# Patient Record
Sex: Female | Born: 1995 | Race: Black or African American | Hispanic: No | Marital: Single | Smoking: Never smoker
Health system: Southern US, Community
[De-identification: ages and names within clinical notes are randomized; demographics above are authoritative.]

## PROBLEM LIST (undated history)

## (undated) DIAGNOSIS — N926 Irregular menstruation, unspecified: Secondary | ICD-10-CM

## (undated) DIAGNOSIS — Z8619 Personal history of other infectious and parasitic diseases: Secondary | ICD-10-CM

## (undated) HISTORY — DX: Irregular menstruation, unspecified: N92.6

## (undated) HISTORY — PX: TONSILLECTOMY: SUR1361

## (undated) HISTORY — DX: Personal history of other infectious and parasitic diseases: Z86.19

---

## 2015-01-18 ENCOUNTER — Ambulatory Visit (INDEPENDENT_AMBULATORY_CARE_PROVIDER_SITE_OTHER): Payer: BLUE CROSS/BLUE SHIELD | Admitting: Family Medicine

## 2015-01-18 ENCOUNTER — Encounter: Payer: Self-pay | Admitting: Family Medicine

## 2015-01-18 VITALS — BP 112/78 | HR 68 | Ht 62.5 in | Wt 132.8 lb

## 2015-01-18 DIAGNOSIS — Z7189 Other specified counseling: Secondary | ICD-10-CM

## 2015-01-18 DIAGNOSIS — Z30019 Encounter for initial prescription of contraceptives, unspecified: Secondary | ICD-10-CM

## 2015-01-18 DIAGNOSIS — Z23 Encounter for immunization: Secondary | ICD-10-CM

## 2015-01-18 DIAGNOSIS — Z113 Encounter for screening for infections with a predominantly sexual mode of transmission: Secondary | ICD-10-CM

## 2015-01-18 DIAGNOSIS — N926 Irregular menstruation, unspecified: Secondary | ICD-10-CM | POA: Diagnosis not present

## 2015-01-18 DIAGNOSIS — R350 Frequency of micturition: Secondary | ICD-10-CM | POA: Diagnosis not present

## 2015-01-18 DIAGNOSIS — Z7689 Persons encountering health services in other specified circumstances: Secondary | ICD-10-CM

## 2015-01-18 LAB — POCT URINALYSIS DIPSTICK
BILIRUBIN UA: NEGATIVE
GLUCOSE UA: NEGATIVE
KETONES UA: NEGATIVE
Leukocytes, UA: NEGATIVE
Nitrite, UA: NEGATIVE
UROBILINOGEN UA: NEGATIVE
pH, UA: 6

## 2015-01-18 LAB — POCT URINE PREGNANCY: Preg Test, Ur: NEGATIVE

## 2015-01-18 MED ORDER — MEDROXYPROGESTERONE ACETATE 150 MG/ML IM SUSP
150.0000 mg | Freq: Once | INTRAMUSCULAR | Status: AC
  Administered 2015-01-18: 150 mg via INTRAMUSCULAR

## 2015-01-18 NOTE — Progress Notes (Signed)
   Subjective:    Patient ID: Shannon Ware, female    DOB: August 30, 1995, 19 y.o.   MRN: [id number]  HPI Chief Complaint  Patient presents with  . get est    new pt get estbalished.irregular periods for a straight month and would like to start back depo get not have a period. will get flu shot today. would like std testing as well today   She is here to establish primary care. She would like to start getting Depo-Provera injections and would like STD testing. No other concerns.  Reports childhood asthma but grew out of it and no longer has any symptoms, has been years.   Works at [employer] and and in school at [employer] and studying to be in [employer]  Single, is not currently sexually active, last time 3 months ago. 2 sexual partners in past year, men only.  LMP: history of irregular periods and wants Depo injection. She started Depo injections 3 years ago and used them for approximately 2 years and stopped due to moving. She had no side effects or issues with the injections and states it stopped her period completely and she liked this.States her period did not return for almost 11 months after stopping period.  She has been having a month long period.  Reports mild general abdominal cramping and no abnormal vaginal discharge. Denies itching, vaginal irritation, fever, chills, nausea, vomiting. Reports recent change in eating habits and is only eating 1 or 2 meals per day and not eating much fiber.  Urinary frequency and feels like she doesn't empty her bladder all the time. Denies dysuria, urgency, or leakage.   States she had Chlamydia in past. Would like testing for STD including HIV and Syphilis.  Wants flu shot.   Reviewed allergies, medications, past medical, surgical, family and social history.   Review of Systems Pertinent positives and negatives in the history of present illness.    Objective:   Physical Exam  Alert and in no distress. Not otherwise examined.  Urine pregnancy  negative UA dipstick positive for blood (having vaginal bleeding) and trace of protein    Assessment & Plan:  Encounter for initial prescription of contraceptives - Plan: POCT urine pregnancy  Needs flu shot - Plan: Flu Vaccine QUAD 36+ mos IM  Encounter to establish care  Urinary frequency - Plan: POCT urinalysis dipstick  Screening for STD (sexually transmitted disease) - Plan: GC/chlamydia probe amp, urine, HIV antibody, RPR  Irregular menses  Spent a minimum of 45 minutes with patient and half of that in counseling regarding STDs, contraception, diet and exercise. Negative urine pregnancy test today. Discussed risks, side effects of Depo injection and patient is aware and had success with this in past. She verbalized understanding that she should continue to use a barrier method for next week as the Depo is not automatic birth control and does not protect against STDs. She will let me know if urinary symptoms continue. Discussed that vaginal bleeding may still take a few days to subside after getting the injection.  Flu shot was given. She will get her previous medical records including immunization sent to us.

## 2015-01-18 NOTE — Patient Instructions (Signed)

## 2015-01-19 LAB — GC/CHLAMYDIA PROBE AMP, URINE
CHLAMYDIA, SWAB/URINE, PCR: NEGATIVE
GC PROBE AMP, URINE: NEGATIVE

## 2015-01-19 LAB — RPR

## 2015-01-19 LAB — HIV ANTIBODY (ROUTINE TESTING W REFLEX): HIV 1&2 Ab, 4th Generation: NONREACTIVE

## 2015-03-14 ENCOUNTER — Emergency Department (HOSPITAL_COMMUNITY)
Admission: EM | Admit: 2015-03-14 | Discharge: 2015-03-14 | Disposition: A | Discharge status: Home / Self Care | Payer: Medicaid Other | Attending: Emergency Medicine | Admitting: Emergency Medicine

## 2015-03-14 ENCOUNTER — Encounter (HOSPITAL_COMMUNITY): Payer: Self-pay | Admitting: Vascular Surgery

## 2015-03-14 DIAGNOSIS — R110 Nausea: Secondary | ICD-10-CM | POA: Insufficient documentation

## 2015-03-14 DIAGNOSIS — F17200 Nicotine dependence, unspecified, uncomplicated: Secondary | ICD-10-CM | POA: Insufficient documentation

## 2015-03-14 DIAGNOSIS — Z3202 Encounter for pregnancy test, result negative: Secondary | ICD-10-CM | POA: Insufficient documentation

## 2015-03-14 DIAGNOSIS — N898 Other specified noninflammatory disorders of vagina: Secondary | ICD-10-CM | POA: Insufficient documentation

## 2015-03-14 DIAGNOSIS — Z8742 Personal history of other diseases of the female genital tract: Secondary | ICD-10-CM | POA: Insufficient documentation

## 2015-03-14 DIAGNOSIS — Z8619 Personal history of other infectious and parasitic diseases: Secondary | ICD-10-CM | POA: Diagnosis not present

## 2015-03-14 DIAGNOSIS — Z711 Person with feared health complaint in whom no diagnosis is made: Secondary | ICD-10-CM

## 2015-03-14 LAB — URINE MICROSCOPIC-ADD ON

## 2015-03-14 LAB — URINALYSIS, ROUTINE W REFLEX MICROSCOPIC
BILIRUBIN URINE: NEGATIVE
GLUCOSE, UA: NEGATIVE mg/dL
Ketones, ur: 15 mg/dL — AB
Nitrite: NEGATIVE
PH: 6 (ref 5.0–8.0)
Protein, ur: NEGATIVE mg/dL
SPECIFIC GRAVITY, URINE: 1.037 — AB (ref 1.005–1.030)

## 2015-03-14 LAB — POC URINE PREG, ED: Preg Test, Ur: NEGATIVE

## 2015-03-14 LAB — WET PREP, GENITAL
Clue Cells Wet Prep HPF POC: NONE SEEN
SPERM: NONE SEEN
TRICH WET PREP: NONE SEEN
Yeast Wet Prep HPF POC: NONE SEEN

## 2015-03-14 MED ORDER — STERILE WATER FOR INJECTION IJ SOLN
INTRAMUSCULAR | Status: AC
  Administered 2015-03-14: 0.9 mL
  Filled 2015-03-14: qty 10

## 2015-03-14 MED ORDER — AZITHROMYCIN 250 MG PO TABS
1000.0000 mg | ORAL_TABLET | Freq: Once | ORAL | Status: AC
  Administered 2015-03-14: 1000 mg via ORAL
  Filled 2015-03-14: qty 4

## 2015-03-14 MED ORDER — CEFTRIAXONE SODIUM 250 MG IJ SOLR
250.0000 mg | Freq: Once | INTRAMUSCULAR | Status: AC
  Administered 2015-03-14: 250 mg via INTRAMUSCULAR
  Filled 2015-03-14: qty 250

## 2015-03-14 NOTE — ED Provider Notes (Signed)
CSN: [id number]     Arrival date & time 03/14/15  1347 History  By signing my name below, I, Shannon Ware, attest that this documentation has been prepared under the direction and in the presence of [employer] Department StoresHanna Patel-Mills, PA-C. Electronically Signed: Placido SouLogan Ware, ED Scribe. 03/14/2015. 3:05 PM.   Chief Complaint  Patient presents with  . Vaginal Discharge   The history is provided by the patient and a parent. No language interpreter was used.    HPI Comments: Shannon KaufmannShania Ware is a 19 y.o. female with a hx of chlamydia 3 years ago who presents to the Emergency Department complaining of mild, yellow, non-odorous, vaginal discharge with onset 3 days ago. Pt notes having unprotected intercourse 4 days ago with a female partner further noting having 2 female sexual partners in the past 6 months. Pt notes having irregular menstrual cycles due to Depo-Provera injections. She denies n/v and abd pain.   Past Medical History  Diagnosis Date  . History of chlamydia   . Irregular menses    Past Surgical History  Procedure Laterality Date  . Tonsillectomy     Family History  Problem Relation Age of Onset  . Hypertension Mother   . Depression Mother   . Deep vein thrombosis Father   . Diabetes Maternal Grandfather    Social History  Substance Use Topics  . Smoking status: Current Every Day Smoker    Types: Cigars  . Smokeless tobacco: None  . Alcohol Use: No   OB History    No data available     Review of Systems  Gastrointestinal: Positive for nausea. Negative for vomiting and abdominal pain.  Genitourinary: Positive for vaginal discharge.  All other systems reviewed and are negative.  Allergies  Review of patient's allergies indicates no known allergies.  Home Medications   Prior to Admission medications   Not on File   BP 119/83 mmHg  Pulse 88  Temp(Src) 98.2 F (36.8 C) (Oral)  Resp 18  SpO2 100% Physical Exam  Constitutional: She is oriented to person, place, and time.  She appears well-developed and well-nourished.  HENT:  Head: Normocephalic and atraumatic.  Mouth/Throat: No oropharyngeal exudate.  Neck: Normal range of motion. No tracheal deviation present.  Cardiovascular: Normal rate.   Pulmonary/Chest: Effort normal. No respiratory distress.  Abdominal: Soft. There is no tenderness. There is no rebound and no guarding.  No abd TTP; no guarding or rebound   Genitourinary:  Chaperone present Copious amounts of malodorous greenish discharge but no vaginal bleeding; cervical os is closed; no adnexal tenderness  Musculoskeletal: Normal range of motion.  Neurological: She is alert and oriented to person, place, and time.  Skin: Skin is warm and dry. She is not diaphoretic.  Psychiatric: She has a normal mood and affect. Her behavior is normal.  Nursing note and vitals reviewed.  ED Course  Procedures  DIAGNOSTIC STUDIES: Oxygen Saturation is 100% on RA, normal by my interpretation.    COORDINATION OF CARE: 2:37 PM Pt presents today due to vaginal discharge. Discussed next steps with pt including a pelvic exam. Pt agreed to plan.   Labs Review Labs Reviewed  WET PREP, GENITAL - Abnormal; Notable for the following:    WBC, Wet Prep HPF POC MANY (*)    All other components within normal limits  URINALYSIS, ROUTINE W REFLEX MICROSCOPIC (NOT AT [facility]) - Abnormal; Notable for the following:    Color, Urine AMBER (*)    APPearance CLOUDY (*)    Specific  Gravity, Urine 1.037 (*)    Hgb urine dipstick TRACE (*)    Ketones, ur 15 (*)    Leukocytes, UA LARGE (*)    All other components within normal limits  URINE MICROSCOPIC-ADD ON - Abnormal; Notable for the following:    Squamous Epithelial / LPF 6-30 (*)    Bacteria, UA FEW (*)    All other components within normal limits  URINE CULTURE  POC URINE PREG, ED  GC/CHLAMYDIA PROBE AMP () NOT AT [facility]    Imaging Review No results found. I have personally reviewed and evaluated these lab  results as part of my medical decision-making.   EKG Interpretation None      MDM   Final diagnoses:  Vaginal discharge  Concern about STD in female without diagnosis   Patient presents for vaginal discharge after having intercourse. She has many white blood cells on wet prep. She was treated for gonorrhea and chlamydia. She is not pregnant. Pt advised on safe sex practices and understands that they have GC/Chlamydia cultures pending and will result in 2-3 days.  Pt encouraged to follow up at local health department for future STI checks. No concern for PID. Urine culture pending but she does not have any urinary symptoms such as dysuria, hematuria, urinary frequency, back pain, nausea, or vomiting. I discussed return precautions. Pt appears safe for discharge.  I personally performed the services described in this documentation, which was scribed in my presence. The recorded information has been reviewed and is accurate.  Medications  azithromycin (ZITHROMAX) tablet 1,000 mg (1,000 mg Oral Given 03/14/15 1611)  cefTRIAXone (ROCEPHIN) injection 250 mg (250 mg Intramuscular Given 03/14/15 1611)  sterile water (preservative free) injection (0.9 mLs  Given 03/14/15 1611)   Filed Vitals:   03/14/15 1403 03/14/15 1612  BP: 125/92 119/83  Pulse: 95 88  Temp: 98.1 F (36.7 C) 98.2 F (36.8 C)  Resp: 16 [address] PA-C 03/16/15 2356  Donnetta Hutching, MD 03/17/15 1048

## 2015-03-14 NOTE — ED Notes (Signed)
Pt reports to the ED for eval of thick yellow vaginal d/c since Sunday. Reports had unprotected sexual intercourse on Saturday. Denies any abd pain or vaginal bleeding. Pt A&Ox4, resp e/u, and skin warm and dry.

## 2015-03-15 LAB — GC/CHLAMYDIA PROBE AMP (~~LOC~~) NOT AT ARMC
CHLAMYDIA, DNA PROBE: NEGATIVE
Neisseria Gonorrhea: NEGATIVE

## 2015-03-15 LAB — URINE CULTURE
Culture: 20000
SPECIAL REQUESTS: NORMAL

## 2015-03-22 ENCOUNTER — Telehealth (HOSPITAL_BASED_OUTPATIENT_CLINIC_OR_DEPARTMENT_OTHER): Payer: Self-pay | Admitting: Emergency Medicine

## 2015-06-13 ENCOUNTER — Encounter: Payer: Self-pay | Admitting: Family Medicine

## 2015-06-13 ENCOUNTER — Ambulatory Visit (INDEPENDENT_AMBULATORY_CARE_PROVIDER_SITE_OTHER): Payer: BLUE CROSS/BLUE SHIELD | Admitting: Family Medicine

## 2015-06-13 ENCOUNTER — Other Ambulatory Visit: Payer: Self-pay | Admitting: Family Medicine

## 2015-06-13 VITALS — BP 126/72 | HR 68 | Wt 125.4 lb

## 2015-06-13 DIAGNOSIS — Z202 Contact with and (suspected) exposure to infections with a predominantly sexual mode of transmission: Secondary | ICD-10-CM | POA: Diagnosis not present

## 2015-06-13 DIAGNOSIS — Z23 Encounter for immunization: Secondary | ICD-10-CM | POA: Diagnosis not present

## 2015-06-13 DIAGNOSIS — S61209A Unspecified open wound of unspecified finger without damage to nail, initial encounter: Secondary | ICD-10-CM

## 2015-06-13 DIAGNOSIS — Z3049 Encounter for surveillance of other contraceptives: Secondary | ICD-10-CM

## 2015-06-13 LAB — POCT URINE PREGNANCY: PREG TEST UR: NEGATIVE

## 2015-06-13 MED ORDER — MEDROXYPROGESTERONE ACETATE 150 MG/ML IM SUSP
150.0000 mg | Freq: Once | INTRAMUSCULAR | Status: AC
  Administered 2015-06-13: 150 mg via INTRAMUSCULAR

## 2015-06-13 NOTE — Patient Instructions (Signed)
Safe Sex  Safe sex is about reducing the risk of giving or getting a sexually transmitted disease (STD). STDs are spread through sexual contact involving the genitals, mouth, or rectum. Some STDs can be cured and others cannot. Safe sex can also prevent unintended pregnancies.   WHAT ARE SOME SAFE SEX PRACTICES?  · Limit your sexual activity to only one partner who is having sex with only you.  · Talk to your partner about his or her past partners, past STDs, and drug use.  · Use a condom every time you have sexual intercourse. This includes vaginal, oral, and anal sexual activity. Both females and males should wear condoms during oral sex. Only use latex or polyurethane condoms and water-based lubricants. Using petroleum-based lubricants or oils to lubricate a condom will weaken the condom and increase the chance that it will break. The condom should be in place from the beginning to the end of sexual activity. Wearing a condom reduces, but does not completely eliminate, your risk of getting or giving an STD. STDs can be spread by contact with infected body fluids and skin.  · Get vaccinated for hepatitis B and HPV.  · Avoid alcohol and recreational drugs, which can affect your judgment. You may forget to use a condom or participate in high-risk sex.  · For females, avoid douching after sexual intercourse. Douching can spread an infection farther into the reproductive tract.  · Check your body for signs of sores, blisters, rashes, or unusual discharge. See your health care provider if you notice any of these signs.  · Avoid sexual contact if you have symptoms of an infection or are being treated for an STD. If you or your partner has herpes, avoid sexual contact when blisters are present. Use condoms at all other times.  · If you are at risk of being infected with HIV, it is recommended that you take a prescription medicine daily to prevent HIV infection. This is called pre-exposure prophylaxis (PrEP). You are  considered at risk if:    You are a man who has sex with other men (MSM).    You are a heterosexual man or woman who is sexually active with more than one partner.    You take drugs by injection.    You are sexually active with a partner who has HIV.  · Talk with your health care provider about whether you are at high risk of being infected with HIV. If you choose to begin PrEP, you should first be tested for HIV. You should then be tested every 3 months for as long as you are taking PrEP.  · See your health care provider for regular screenings, exams, and tests for other STDs. Before having sex with a new partner, each of you should be screened for STDs and should talk about the results with each other.  WHAT ARE THE BENEFITS OF SAFE SEX?   · There is less chance of getting or giving an STD.  · You can prevent unwanted or unintended pregnancies.  · By discussing safe sex concerns with your partner, you may increase feelings of intimacy, comfort, trust, and honesty between the two of you.     This information is not intended to replace advice given to you by your health care provider. Make sure you discuss any questions you have with your health care provider.     Document Released: 04/24/2004 Document Revised: 04/07/2014 Document Reviewed: 09/08/2011  Elsevier Interactive Patient Education ©2016 Elsevier Inc.

## 2015-06-13 NOTE — Progress Notes (Signed)
   Subjective:    Patient ID: Shannon Ware, female    DOB: April 21, 1995, 20 y.o.   MRN: [id number]  HPI Chief Complaint  Patient presents with  . cut finger    cut finger open-    She is here with complaints of laceration to tip of index finger on right hand. She states she was using a kitchen knife last night at 11 pm and accidentally cut her finger. Bandaid in place upon arrival and bleeding controlled.  She denies fever, chills, nausea.  Last tetanus was 2008.  She states she would also like to get her Depo-Provera injection while she is here. She last had one in October 2016.  She is also requesting STD testing. She denies vaginal discharge, itching, irritation. States she has 1 female sexual partner but is not sure he is faithful.   Review of Systems Pertinent positives and negatives in the history of present illness.     Objective:   Physical Exam BP 126/72 mmHg  Pulse 68  Wt 125 lb 6.4 oz (56.881 kg)  Avulsion with moderate tissue loss to the volar pad on index finger of right hand, no tendon or bone exposure. Nail intact to tip of finger. normal cap refill, sensation and ROM intact.      Assessment & Plan:  Avulsion, finger tip, initial encounter  Need for Tdap vaccination - Plan: Tdap vaccine greater than or equal to 7yo IM  Encounter for surveillance of other contraceptive - Plan: POCT urine pregnancy, medroxyPROGESTERone (DEPO-PROVERA) injection 150 mg  Possible exposure to STD - Plan: GC/chlamydia probe amp, urine  Discussed keeping wound clean and dry and covered as there is no tissue to suture. Also discussed that this will take several months to heal and that she may experience some numbness to the tip of the finger until the nerve endings have time to heal.  Tetanus updated today. Per patient request, Depo-Provera injection was given. Urine pregnancy test negative. Recommend that since there was a gap between her injections that she should use a barrier method for  protection against pregnancy for at least one week and that the injection will not be immediately effective.  GC chlamydia urine test ordered. Discussed safe sex.

## 2015-06-14 LAB — GC/CHLAMYDIA PROBE AMP
CT PROBE, AMP APTIMA: NOT DETECTED
GC Probe RNA: NOT DETECTED

## 2015-07-23 ENCOUNTER — Encounter: Payer: Self-pay | Admitting: Family Medicine

## 2017-02-05 ENCOUNTER — Encounter (HOSPITAL_COMMUNITY): Payer: Self-pay | Admitting: Emergency Medicine

## 2017-02-05 ENCOUNTER — Emergency Department (HOSPITAL_COMMUNITY)
Admission: EM | Admit: 2017-02-05 | Discharge: 2017-02-05 | Disposition: A | Discharge status: Home / Self Care | Payer: No Typology Code available for payment source | Attending: Emergency Medicine | Admitting: Emergency Medicine

## 2017-02-05 ENCOUNTER — Other Ambulatory Visit: Payer: Self-pay

## 2017-02-05 DIAGNOSIS — Y999 Unspecified external cause status: Secondary | ICD-10-CM | POA: Insufficient documentation

## 2017-02-05 DIAGNOSIS — Y9389 Activity, other specified: Secondary | ICD-10-CM | POA: Diagnosis not present

## 2017-02-05 DIAGNOSIS — F17200 Nicotine dependence, unspecified, uncomplicated: Secondary | ICD-10-CM | POA: Diagnosis not present

## 2017-02-05 DIAGNOSIS — Y92410 Unspecified street and highway as the place of occurrence of the external cause: Secondary | ICD-10-CM | POA: Diagnosis not present

## 2017-02-05 DIAGNOSIS — R51 Headache: Secondary | ICD-10-CM | POA: Insufficient documentation

## 2017-02-05 MED ORDER — METOCLOPRAMIDE HCL 10 MG PO TABS
10.0000 mg | ORAL_TABLET | Freq: Once | ORAL | Status: AC
  Administered 2017-02-05: 10 mg via ORAL
  Filled 2017-02-05: qty 1

## 2017-02-05 MED ORDER — DIPHENHYDRAMINE HCL 25 MG PO CAPS
25.0000 mg | ORAL_CAPSULE | Freq: Once | ORAL | Status: AC
  Administered 2017-02-05: 25 mg via ORAL
  Filled 2017-02-05: qty 1

## 2017-02-05 MED ORDER — IBUPROFEN 400 MG PO TABS
600.0000 mg | ORAL_TABLET | Freq: Once | ORAL | Status: AC
  Administered 2017-02-05: 600 mg via ORAL
  Filled 2017-02-05: qty 1
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
## 2017-03-31 NOTE — L&D Delivery Note (Addendum)
Delivery Note At 5:14 PM a viable female was delivered via Vaginal, Spontaneous (Presentation: cephalic).  APGAR: 7, 9; weight  pending.   Placenta status: spont and intact.  Cord: 3V  with the following complications: none.  Cord pH: n/a  Anesthesia:  epidural Episiotomy: None Lacerations: None Suture Repair: n/a Est. Blood Loss (mL): 50  Mom to postpartum.  Baby to Couplet care / Skin to Skin. Plans nexplanon for bc and plans to breast feed.  Shannon Ware 02/24/2018, 5:48 PM

## 2017-07-15 ENCOUNTER — Emergency Department (HOSPITAL_COMMUNITY)
Admission: EM | Admit: 2017-07-15 | Discharge: 2017-07-15 | Disposition: A | Discharge status: Home / Self Care | Payer: Medicaid Other | Attending: Emergency Medicine | Admitting: Emergency Medicine

## 2017-07-15 ENCOUNTER — Emergency Department (HOSPITAL_COMMUNITY): Payer: Medicaid Other

## 2017-07-15 ENCOUNTER — Encounter (HOSPITAL_COMMUNITY): Payer: Self-pay | Admitting: Emergency Medicine

## 2017-07-15 ENCOUNTER — Other Ambulatory Visit: Payer: Self-pay

## 2017-07-15 DIAGNOSIS — F17210 Nicotine dependence, cigarettes, uncomplicated: Secondary | ICD-10-CM | POA: Insufficient documentation

## 2017-07-15 DIAGNOSIS — O26891 Other specified pregnancy related conditions, first trimester: Secondary | ICD-10-CM | POA: Diagnosis not present

## 2017-07-15 DIAGNOSIS — R1032 Left lower quadrant pain: Secondary | ICD-10-CM | POA: Diagnosis not present

## 2017-07-15 DIAGNOSIS — O99331 Smoking (tobacco) complicating pregnancy, first trimester: Secondary | ICD-10-CM | POA: Insufficient documentation

## 2017-07-15 DIAGNOSIS — Z3A01 Less than 8 weeks gestation of pregnancy: Secondary | ICD-10-CM

## 2017-07-15 LAB — URINALYSIS, ROUTINE W REFLEX MICROSCOPIC
Bilirubin Urine: NEGATIVE
GLUCOSE, UA: NEGATIVE mg/dL
Hgb urine dipstick: NEGATIVE
Ketones, ur: 5 mg/dL — AB
LEUKOCYTES UA: NEGATIVE
NITRITE: NEGATIVE
PH: 6 (ref 5.0–8.0)
Protein, ur: NEGATIVE mg/dL
SPECIFIC GRAVITY, URINE: 1.012 (ref 1.005–1.030)

## 2017-07-15 LAB — CBC
HEMATOCRIT: 39.4 % (ref 36.0–46.0)
HEMOGLOBIN: 13 g/dL (ref 12.0–15.0)
MCH: 28.4 pg (ref 26.0–34.0)
MCHC: 33 g/dL (ref 30.0–36.0)
MCV: 86 fL (ref 78.0–100.0)
Platelets: 345 10*3/uL (ref 150–400)
RBC: 4.58 MIL/uL (ref 3.87–5.11)
RDW: 12 % (ref 11.5–15.5)
WBC: 10.4 10*3/uL (ref 4.0–10.5)

## 2017-07-15 LAB — COMPREHENSIVE METABOLIC PANEL
ALT: 11 U/L — ABNORMAL LOW (ref 14–54)
ANION GAP: 9 (ref 5–15)
AST: 19 U/L (ref 15–41)
Albumin: 4.2 g/dL (ref 3.5–5.0)
Alkaline Phosphatase: 67 U/L (ref 38–126)
BUN: 7 mg/dL (ref 6–20)
CHLORIDE: 101 mmol/L (ref 101–111)
CO2: 24 mmol/L (ref 22–32)
Calcium: 9.2 mg/dL (ref 8.9–10.3)
Creatinine, Ser: 0.8 mg/dL (ref 0.44–1.00)
GFR calc Af Amer: >60 mL/min (ref >60)
Glucose, Bld: 69 mg/dL (ref 65–99)
POTASSIUM: 3.3 mmol/L — AB (ref 3.5–5.1)
Sodium: 134 mmol/L — ABNORMAL LOW (ref 135–145)
TOTAL PROTEIN: 7.2 g/dL (ref 6.5–8.1)
Total Bilirubin: 0.6 mg/dL (ref 0.3–1.2)

## 2017-07-15 LAB — LIPASE, BLOOD: LIPASE: 41 U/L (ref 11–51)

## 2017-07-15 LAB — I-STAT BETA HCG BLOOD, ED (MC, WL, AP ONLY): I-stat hCG, quantitative: >2000 m[IU]/mL — ABNORMAL HIGH (ref <5)

## 2017-07-15 MED ORDER — METOCLOPRAMIDE HCL 10 MG PO TABS: 10.0000 mg | ORAL_TABLET | Freq: Three times a day (TID) | ORAL | 0 refills | Status: DC | PRN

## 2017-07-15 NOTE — ED Provider Notes (Signed)
[facility] EMERGENCY DEPARTMENT Provider Note   CSN: [id number] Arrival date & time: 07/15/17  1053     History   Chief Complaint Chief Complaint  Patient presents with  . Abdominal Pain  . Nausea    HPI Shannon Ware is a 22 y.o. female.  HPI Patient presented to the emergency room for evaluation of abdominal pain.  Patient states she has had some lower abdominal cramping on the left side in the left flank for the last few days.  The cramping seems to come and go and increases with certain activities and food.  She also has felt constantly nauseated.  Patient denies any vomiting.  She denies any diarrhea.  She did notice a dark stool a few days ago and has felt constipated and has not had normal bowel movement in the last few days.  Patient has irregular menstrual periods.  Her last menstrual period was in January.  She is not sure if she could be pregnant. Past Medical History:  Diagnosis Date  . History of chlamydia   . Irregular menses     There are no active problems to display for this patient.   Past Surgical History:  Procedure Laterality Date  . TONSILLECTOMY       OB History   None      Home Medications    Prior to Admission medications   Medication Sig Start Date End Date Taking? Authorizing Provider  metoCLOPramide (REGLAN) 10 MG tablet Take 1 tablet (10 mg total) by mouth every 8 (eight) hours as needed for nausea or vomiting. 07/15/17   Linwood Dibbles, MD    Family History Family History  Problem Relation Age of Onset  . Hypertension Mother   . Depression Mother   . Deep vein thrombosis Father   . Diabetes Maternal Grandfather     Social History Social History   Tobacco Use  . Smoking status: Current Every Day Smoker    Types: Cigars  . Smokeless tobacco: Never Used  Substance Use Topics  . Alcohol use: No    Alcohol/week: 0.0 oz  . Drug use: No     Allergies   Patient has no known allergies.   Review of  Systems Review of Systems  Constitutional: Negative for fever.  Respiratory: Negative for cough and shortness of breath.   Cardiovascular: Negative for chest pain.  Gastrointestinal: Negative for anal bleeding and blood in stool.  All other systems reviewed and are negative.    Physical Exam Updated Vital Signs BP 139/78   Pulse 81   Temp 98.3 F (36.8 C) (Oral)   Resp 16   Ht 1.6 m (5\' 3" )   Wt 54.4 kg (120 lb)   LMP 04/16/2017 (Approximate) Comment: Pt reports irregular periods   SpO2 100%   BMI 21.26 kg/m   Physical Exam  Constitutional: She appears well-developed and well-nourished. No distress.  HENT:  Head: Normocephalic and atraumatic.  Right Ear: External ear normal.  Left Ear: External ear normal.  Eyes: Conjunctivae are normal. Right eye exhibits no discharge. Left eye exhibits no discharge. No scleral icterus.  Neck: Neck supple. No tracheal deviation present.  Cardiovascular: Normal rate, regular rhythm and intact distal pulses.  Pulmonary/Chest: Effort normal and breath sounds normal. No stridor. No respiratory distress. She has no wheezes. She has no rales.  Abdominal: Soft. Bowel sounds are normal. She exhibits no distension. There is no tenderness. There is no rebound and no guarding. No hernia.  Musculoskeletal: She  exhibits no edema or tenderness.  Neurological: She is alert. She has normal strength. No cranial nerve deficit (no facial droop, extraocular movements intact, no slurred speech) or sensory deficit. She exhibits normal muscle tone. She displays no seizure activity. Coordination normal.  Skin: Skin is warm and dry. No rash noted.  Psychiatric: She has a normal mood and affect.  Nursing note and vitals reviewed.    ED Treatments / Results  Labs (all labs ordered are listed, but only abnormal results are displayed) Labs Reviewed  COMPREHENSIVE METABOLIC PANEL - Abnormal; Notable for the following components:      Result Value   Sodium 134 (*)     Potassium 3.3 (*)    ALT 11 (*)    All other components within normal limits  URINALYSIS, ROUTINE W REFLEX MICROSCOPIC - Abnormal; Notable for the following components:   Ketones, ur 5 (*)    All other components within normal limits  I-STAT BETA HCG BLOOD, ED ([facility], [facility], [facility] ONLY) - Abnormal; Notable for the following components:   I-stat hCG, quantitative >2,000.0 (*)    All other components within normal limits  LIPASE, BLOOD  CBC  HCG, QUANTITATIVE, PREGNANCY  RPR  HIV ANTIBODY (ROUTINE TESTING)  ABO/RH  GC/CHLAMYDIA PROBE AMP ([place]) NOT AT [facility]    Radiology [place] Ob Comp < 14 Wks  Result Date: 07/15/2017 CLINICAL DATA:  Pelvic pain for 3 days.  Unknown LMP. EXAM: OBSTETRIC <14 WK US AND TRANSVAGINAL OB US TECHNIQUE: Both transabdominal and transvaginal ultrasound examinations were performed for complete evaluation of the gestation as well as the maternal uterus, adnexal regions, and pelvic cul-de-sac. Transvaginal technique was performed to assess early pregnancy. COMPARISON:  None. FINDINGS: Intrauterine gestational sac: Single Yolk sac:  Visualized. Embryo:  Visualized. Cardiac Activity: Visualized. Heart Rate: 145 bpm CRL:  8 mm   6 w   5 d                  US EDC: 03/05/2018 Subchorionic hemorrhage:  None visualized. Maternal uterus/adnexae: Both ovaries are normal in appearance. No mass or abnormal free fluid identified. IMPRESSION: Single living IUP measuring 6 weeks 5 days, with US EDC of 03/05/2018. No significant maternal uterine or adnexal abnormality identified. Electronically Signed   By: Myles RosenthalJohn  Stahl M.D.   On: 07/15/2017 16:55   [place] Ob Transvaginal  Result Date: 07/15/2017 CLINICAL DATA:  Pelvic pain for 3 days.  Unknown LMP. EXAM: OBSTETRIC <14 WK US AND TRANSVAGINAL OB US TECHNIQUE: Both transabdominal and transvaginal ultrasound examinations were performed for complete evaluation of the gestation as well as the maternal uterus, adnexal regions, and pelvic cul-de-sac.  Transvaginal technique was performed to assess early pregnancy. COMPARISON:  None. FINDINGS: Intrauterine gestational sac: Single Yolk sac:  Visualized. Embryo:  Visualized. Cardiac Activity: Visualized. Heart Rate: 145 bpm CRL:  8 mm   6 w   5 d                  US EDC: 03/05/2018 Subchorionic hemorrhage:  None visualized. Maternal uterus/adnexae: Both ovaries are normal in appearance. No mass or abnormal free fluid identified. IMPRESSION: Single living IUP measuring 6 weeks 5 days, with US EDC of 03/05/2018. No significant maternal uterine or adnexal abnormality identified. Electronically Signed   By: Myles RosenthalJohn  Stahl M.D.   On: 07/15/2017 16:55    Procedures Procedures (including critical care time)  Medications Ordered in ED Medications - No data to display   Initial Impression / Assessment  and Plan / ED Course  I have reviewed the triage vital signs and the nursing notes.  Pertinent labs & imaging results that were available during my care of the patient were reviewed by me and considered in my medical decision making (see chart for details).  Clinical Course as of Jul 15 1800  Wed Jul 15, 2017  1505 Patient's pregnancy test is positive.  Labs are otherwise reassuring.  She thought she might be but she did not take a home pregnancy test.  Plan On pelvic ultrasound and a pelvic exam.   [JK]  1757 Pt was seen by me in a hallway bed.   Pt does not want to wait any longer to move into a room so that we can do the pelvic exam.  She will follow up with an OB doctor.  She is not having any bleed.  I doubt any emergency issue at this point.  Pt is comfortable in no distress.   [JK]    Clinical Course User Index [JK] Linwood Dibbles, MD    Patient presented to the emergency room for evaluation of abdominal pain cramping and nausea.  Patient was not aware that she was pregnant.  Patient's exam is reassuring.  She has no tenderness on exam.  Her laboratory tests were notable for positive pregnancy test.   Her pelvic ultrasound shows an intrauterine pregnancy.  I plan on doing a pelvic exam however the patient was in the hallway and she does not want to wait any longer.  I doubt any emergency issues at this point.  Patient is stable for discharge and will follow up with an OB doctor.  Final Clinical Impressions(s) / ED Diagnoses   Final diagnoses:  Less than [redacted] weeks gestation of pregnancy    ED Discharge Orders        Ordered    metoCLOPramide (REGLAN) 10 MG tablet  Every 8 hours PRN     07/15/17 1800       Linwood Dibbles, MD 07/15/17 [phone]

## 2017-07-15 NOTE — Discharge Instructions (Addendum)
Follow up with an OB GYN doctor, you can take the nausea medications as needed

## 2017-07-15 NOTE — ED Triage Notes (Addendum)
Pt reports black stool on the 14th and has been unable to have a bowel movement since then, nausea and abd pain on left flank x3 days that comes and goes with increased activity and any food intake.

## 2017-07-15 NOTE — ED Notes (Signed)
Pt states that she is ready to go.  She does not want a vaginal exam and she does not want her blood drawn.  Dr Lynelle DoctorKnapp notified.

## 2017-07-15 NOTE — ED Notes (Signed)
Patient transported to Ultrasound 

## 2017-09-03 ENCOUNTER — Encounter: Payer: Self-pay | Admitting: General Practice

## 2017-09-03 ENCOUNTER — Encounter: Payer: Self-pay | Admitting: Obstetrics and Gynecology

## 2017-09-24 LAB — OB RESULTS CONSOLE RUBELLA ANTIBODY, IGM: Rubella: IMMUNE

## 2017-09-24 LAB — OB RESULTS CONSOLE HIV ANTIBODY (ROUTINE TESTING): HIV: NONREACTIVE

## 2017-09-24 LAB — OB RESULTS CONSOLE RPR: RPR: NONREACTIVE

## 2018-02-02 LAB — OB RESULTS CONSOLE GBS: STREP GROUP B AG: POSITIVE

## 2018-02-04 LAB — OB RESULTS CONSOLE GC/CHLAMYDIA
CHLAMYDIA, DNA PROBE: NEGATIVE
Gonorrhea: NEGATIVE

## 2018-02-23 ENCOUNTER — Encounter (HOSPITAL_COMMUNITY): Payer: Self-pay | Admitting: *Deleted

## 2018-02-23 ENCOUNTER — Inpatient Hospital Stay (HOSPITAL_COMMUNITY)
Admission: AD | Admit: 2018-02-23 | Discharge: 2018-02-26 | DRG: 807 | Disposition: A | Discharge status: Home / Self Care | Payer: Medicaid Other | Attending: Obstetrics and Gynecology | Admitting: Obstetrics and Gynecology

## 2018-02-23 DIAGNOSIS — Z3A38 38 weeks gestation of pregnancy: Secondary | ICD-10-CM

## 2018-02-23 DIAGNOSIS — O1493 Unspecified pre-eclampsia, third trimester: Secondary | ICD-10-CM | POA: Diagnosis present

## 2018-02-23 DIAGNOSIS — O99824 Streptococcus B carrier state complicating childbirth: Secondary | ICD-10-CM | POA: Diagnosis present

## 2018-02-23 DIAGNOSIS — O1494 Unspecified pre-eclampsia, complicating childbirth: Principal | ICD-10-CM | POA: Diagnosis present

## 2018-02-23 DIAGNOSIS — Z87891 Personal history of nicotine dependence: Secondary | ICD-10-CM | POA: Diagnosis not present

## 2018-02-23 DIAGNOSIS — Z3493 Encounter for supervision of normal pregnancy, unspecified, third trimester: Secondary | ICD-10-CM

## 2018-02-23 DIAGNOSIS — O1213 Gestational proteinuria, third trimester: Secondary | ICD-10-CM | POA: Diagnosis present

## 2018-02-23 LAB — CBC WITH DIFFERENTIAL/PLATELET
BASOS PCT: 0 %
Basophils Absolute: 0 10*3/uL (ref 0.0–0.1)
EOS ABS: 0.3 10*3/uL (ref 0.0–0.5)
Eosinophils Relative: 2 %
HCT: 37.2 % (ref 36.0–46.0)
HEMOGLOBIN: 12 g/dL (ref 12.0–15.0)
Lymphocytes Relative: 20 %
Lymphs Abs: 3 10*3/uL (ref 0.7–4.0)
MCH: 28.6 pg (ref 26.0–34.0)
MCHC: 32.3 g/dL (ref 30.0–36.0)
MCV: 88.6 fL (ref 80.0–100.0)
MONOS PCT: 8 %
Monocytes Absolute: 1.2 10*3/uL — ABNORMAL HIGH (ref 0.1–1.0)
NEUTROS PCT: 70 %
Neutro Abs: 10.8 10*3/uL — ABNORMAL HIGH (ref 1.7–7.7)
Platelets: 299 10*3/uL (ref 150–400)
RBC: 4.2 MIL/uL (ref 3.87–5.11)
RDW: 13.1 % (ref 11.5–15.5)
WBC: 15.3 10*3/uL — ABNORMAL HIGH (ref 4.0–10.5)
nRBC: 0 % (ref 0.0–0.2)

## 2018-02-23 LAB — COMPREHENSIVE METABOLIC PANEL
ALBUMIN: 3.3 g/dL — AB (ref 3.5–5.0)
ALK PHOS: 167 U/L — AB (ref 38–126)
ALT: 19 U/L (ref 0–44)
ANION GAP: 10 (ref 5–15)
AST: 25 U/L (ref 15–41)
BUN: 12 mg/dL (ref 6–20)
CHLORIDE: 103 mmol/L (ref 98–111)
CO2: 22 mmol/L (ref 22–32)
Calcium: 9.2 mg/dL (ref 8.9–10.3)
Creatinine, Ser: 0.58 mg/dL (ref 0.44–1.00)
GFR calc non Af Amer: >60 mL/min (ref >60)
GLUCOSE: 128 mg/dL — AB (ref 70–99)
POTASSIUM: 4.2 mmol/L (ref 3.5–5.1)
SODIUM: 135 mmol/L (ref 135–145)
Total Bilirubin: 0.2 mg/dL — ABNORMAL LOW (ref 0.3–1.2)
Total Protein: 6.7 g/dL (ref 6.5–8.1)

## 2018-02-23 LAB — PROTEIN / CREATININE RATIO, URINE
CREATININE, URINE: 173 mg/dL
PROTEIN CREATININE RATIO: 0.09 mg/mg{creat} (ref 0.00–0.15)
Total Protein, Urine: 16 mg/dL

## 2018-02-23 LAB — TYPE AND SCREEN
ABO/RH(D): B POS
Antibody Screen: NEGATIVE

## 2018-02-23 LAB — ABO/RH: ABO/RH(D): B POS

## 2018-02-23 LAB — LACTATE DEHYDROGENASE: LDH: 206 U/L — ABNORMAL HIGH (ref 98–192)

## 2018-02-23 LAB — URIC ACID: URIC ACID, SERUM: 4.4 mg/dL (ref 2.5–7.1)

## 2018-02-23 MED ORDER — TERBUTALINE SULFATE 1 MG/ML IJ SOLN
0.2500 mg | Freq: Once | INTRAMUSCULAR | Status: AC | PRN
  Administered 2018-02-24: 0.25 mg via SUBCUTANEOUS
  Filled 2018-02-23: qty 1

## 2018-02-23 MED ORDER — MISOPROSTOL 25 MCG QUARTER TABLET
25.0000 ug | ORAL_TABLET | ORAL | Status: DC | PRN
  Administered 2018-02-23: 25 ug via VAGINAL
  Filled 2018-02-23 (×2): qty 1

## 2018-02-23 MED ORDER — SOD CITRATE-CITRIC ACID 500-334 MG/5ML PO SOLN
30.0000 mL | ORAL | Status: DC | PRN
  Filled 2018-02-23: qty 15

## 2018-02-23 MED ORDER — OXYCODONE-ACETAMINOPHEN 5-325 MG PO TABS: 1.0000 | ORAL_TABLET | ORAL | Status: DC | PRN

## 2018-02-23 MED ORDER — LACTATED RINGERS IV SOLN
INTRAVENOUS | Status: DC
  Administered 2018-02-23 – 2018-02-24 (×4): via INTRAVENOUS

## 2018-02-23 MED ORDER — LACTATED RINGERS IV SOLN
500.0000 mL | INTRAVENOUS | Status: DC | PRN
  Administered 2018-02-24 (×3): 500 mL via INTRAVENOUS

## 2018-02-23 MED ORDER — OXYTOCIN BOLUS FROM INFUSION: 500.0000 mL | Freq: Once | INTRAVENOUS | Status: DC

## 2018-02-23 MED ORDER — FENTANYL CITRATE (PF) 100 MCG/2ML IJ SOLN
50.0000 ug | INTRAMUSCULAR | Status: DC | PRN
  Administered 2018-02-24: 50 ug via INTRAVENOUS
  Administered 2018-02-24: 100 ug via INTRAVENOUS
  Administered 2018-02-24: 50 ug via INTRAVENOUS
  Administered 2018-02-24: 100 ug via INTRAVENOUS
  Filled 2018-02-23 (×3): qty 2

## 2018-02-23 MED ORDER — LIDOCAINE HCL (PF) 1 % IJ SOLN
30.0000 mL | INTRAMUSCULAR | Status: DC | PRN
Start: 2018-02-23 — End: 2018-02-24
  Filled 2018-02-23: qty 30

## 2018-02-23 MED ORDER — ONDANSETRON HCL 4 MG/2ML IJ SOLN: 4.0000 mg | Freq: Four times a day (QID) | INTRAMUSCULAR | Status: DC | PRN

## 2018-02-23 MED ORDER — OXYTOCIN 40 UNITS IN LACTATED RINGERS INFUSION - SIMPLE MED
2.5000 [IU]/h | INTRAVENOUS | Status: DC
  Administered 2018-02-24: 2.5 [IU]/h via INTRAVENOUS
  Filled 2018-02-23: qty 1000

## 2018-02-23 MED ORDER — OXYCODONE-ACETAMINOPHEN 5-325 MG PO TABS: 2.0000 | ORAL_TABLET | ORAL | Status: DC | PRN

## 2018-02-23 MED ORDER — PENICILLIN G 3 MILLION UNITS IVPB - SIMPLE MED
3.0000 10*6.[IU] | INTRAVENOUS | Status: DC
  Administered 2018-02-24 (×5): 3 10*6.[IU] via INTRAVENOUS
  Filled 2018-02-23 (×6): qty 100

## 2018-02-23 MED ORDER — ACETAMINOPHEN 325 MG PO TABS: 650.0000 mg | ORAL_TABLET | ORAL | Status: DC | PRN

## 2018-02-23 MED ORDER — SODIUM CHLORIDE 0.9 % IV SOLN
5.0000 10*6.[IU] | Freq: Once | INTRAVENOUS | Status: AC
  Administered 2018-02-23: 5 10*6.[IU] via INTRAVENOUS
  Filled 2018-02-23: qty 5

## 2018-02-23 NOTE — H&P (Signed)
Shannon KaufmannShania Ware is a 22 y.o. female presenting for induction of labor for preeclampsia per Dr. Mora ApplPinn. OB History    Gravida  1   Para      Term      Preterm      AB      Living        SAB      TAB      Ectopic      Multiple      Live Births             Past Medical History:  Diagnosis Date  . History of chlamydia   . Irregular menses    Past Surgical History:  Procedure Laterality Date  . TONSILLECTOMY     Family History: family history includes Deep vein thrombosis in her father; Depression in her mother; Diabetes in her maternal grandfather; Hypertension in her father and mother. Social History:  reports that she has quit smoking. Her smoking use included cigars. She has never used smokeless tobacco. She reports that she does not drink alcohol or use drugs.     Maternal Diabetes: No Genetic Screening: Normal Maternal Ultrasounds/Referrals: Normal Fetal Ultrasounds or other Referrals:  None Maternal Substance Abuse:  No Significant Maternal Medications:  None Significant Maternal Lab Results:  Lab values include: Group B Strep positive Other Comments:  Patient's preeclampsia labs were negative. Patient did have elevated blood pressures >140/90 in the office but does not meet diagnostic criteria for gestational hypertension at this time. The headache she reported in the office today has since resolved.   Review of Systems  Eyes: Negative for blurred vision.  Gastrointestinal: Negative for abdominal pain.  Neurological: Negative for headaches.  All other systems reviewed and are negative.  Maternal Medical History:  Fetal activity: Perceived fetal activity is normal.   Last perceived fetal movement was within the past hour.    Prenatal Complications - Diabetes: none.      Dilation: 1.5 Effacement (%): 50 Station: -3 Exam by:: Waynette ButteryGreer CNM   Vitals:   02/23/18 1927  BP: 130/78  Pulse: 97  Resp: 17  Temp: 98.2 F (36.8 C)  TempSrc: Oral    Maternal Exam:  Abdomen: Patient reports no abdominal tenderness. Fundal height is Size=dates.   Estimated fetal weight is 7.5lbs.   Fetal presentation: vertex  Introitus: Normal vulva. Normal vagina.  Pelvis: adequate for delivery.   Cervix: Cervix evaluated by digital exam.     Fetal Exam Fetal Monitor Review: Mode: ultrasound.   Baseline rate: 140.  Variability: moderate (6-25 bpm).   Pattern: accelerations present and no decelerations.    Fetal State Assessment: Category I - tracings are normal.     Physical Exam  Vitals reviewed. Constitutional: She is oriented to person, place, and time. She appears well-developed and well-nourished.  HENT:  Head: Normocephalic.  Eyes: Pupils are equal, round, and reactive to light.  Cardiovascular: Normal rate, regular rhythm and normal heart sounds.  Respiratory: Effort normal and breath sounds normal.  GI: There is no tenderness.  Genitourinary: Vagina normal and uterus normal.  Musculoskeletal: Normal range of motion.  Neurological: She is alert and oriented to person, place, and time.  Skin: Skin is warm and dry.  Psychiatric: She has a normal mood and affect. Her behavior is normal. Judgment and thought content normal.    Prenatal labs: ABO, Rh: --/--/B POS (11/26 1923) Antibody: NEG (11/26 1923) Rubella: Immune (06/27 0000) RPR: Nonreactive (06/27 0000)  HBsAg:   Pending  HIV: Non-reactive (06/27 0000)  GBS: Positive (11/05 0000)   Assessment/Plan: 22 y.o. G1P0 at [redacted]w[redacted]d Proteinuria in pregnancy Cat 1 FHTs  Admit to L&D Cytotec induction of labor Anticipate NSVD  Shannon Ware 02/23/2018, 9:00 PM

## 2018-02-24 ENCOUNTER — Inpatient Hospital Stay (HOSPITAL_COMMUNITY): Payer: Medicaid Other | Admitting: Anesthesiology

## 2018-02-24 ENCOUNTER — Encounter (HOSPITAL_COMMUNITY): Payer: Self-pay | Admitting: Anesthesiology

## 2018-02-24 LAB — CBC
HCT: 38.5 % (ref 36.0–46.0)
Hemoglobin: 12.4 g/dL (ref 12.0–15.0)
MCH: 28.5 pg (ref 26.0–34.0)
MCHC: 32.2 g/dL (ref 30.0–36.0)
MCV: 88.5 fL (ref 80.0–100.0)
Platelets: 279 10*3/uL (ref 150–400)
RBC: 4.35 MIL/uL (ref 3.87–5.11)
RDW: 13.2 % (ref 11.5–15.5)
WBC: 17.7 10*3/uL — AB (ref 4.0–10.5)
nRBC: 0 % (ref 0.0–0.2)

## 2018-02-24 LAB — HEPATITIS B SURFACE ANTIGEN: HEP B S AG: NEGATIVE

## 2018-02-24 LAB — RPR: RPR Ser Ql: NONREACTIVE

## 2018-02-24 MED ORDER — ONDANSETRON HCL 4 MG PO TABS: 4.0000 mg | ORAL_TABLET | ORAL | Status: DC | PRN

## 2018-02-24 MED ORDER — TERBUTALINE SULFATE 1 MG/ML IJ SOLN
0.2500 mg | Freq: Once | INTRAMUSCULAR | Status: AC
  Administered 2018-02-24: 0.25 mg via SUBCUTANEOUS

## 2018-02-24 MED ORDER — PHENYLEPHRINE 40 MCG/ML (10ML) SYRINGE FOR IV PUSH (FOR BLOOD PRESSURE SUPPORT)
80.0000 ug | PREFILLED_SYRINGE | INTRAVENOUS | Status: DC | PRN
  Administered 2018-02-24: 80 ug via INTRAVENOUS
  Filled 2018-02-24: qty 5

## 2018-02-24 MED ORDER — ONDANSETRON HCL 4 MG/2ML IJ SOLN: 4.0000 mg | INTRAMUSCULAR | Status: DC | PRN

## 2018-02-24 MED ORDER — BENZOCAINE-MENTHOL 20-0.5 % EX AERO: 1.0000 "application " | INHALATION_SPRAY | CUTANEOUS | Status: DC | PRN

## 2018-02-24 MED ORDER — OXYCODONE HCL 5 MG PO TABS: 10.0000 mg | ORAL_TABLET | ORAL | Status: DC | PRN

## 2018-02-24 MED ORDER — PRENATAL MULTIVITAMIN CH
1.0000 | ORAL_TABLET | Freq: Every day | ORAL | Status: DC
  Administered 2018-02-25 – 2018-02-26 (×2): 1 via ORAL
  Filled 2018-02-24 (×2): qty 1

## 2018-02-24 MED ORDER — IBUPROFEN 600 MG PO TABS
600.0000 mg | ORAL_TABLET | Freq: Four times a day (QID) | ORAL | Status: DC
  Administered 2018-02-24 – 2018-02-26 (×7): 600 mg via ORAL
  Filled 2018-02-24 (×7): qty 1

## 2018-02-24 MED ORDER — LACTATED RINGERS IV SOLN
500.0000 mL | Freq: Once | INTRAVENOUS | Status: AC
  Administered 2018-02-24: 500 mL via INTRAVENOUS

## 2018-02-24 MED ORDER — TERBUTALINE SULFATE 1 MG/ML IJ SOLN
0.2500 mg | Freq: Once | INTRAMUSCULAR | Status: DC | PRN
  Filled 2018-02-24: qty 1

## 2018-02-24 MED ORDER — SIMETHICONE 80 MG PO CHEW: 80.0000 mg | CHEWABLE_TABLET | ORAL | Status: DC | PRN

## 2018-02-24 MED ORDER — LACTATED RINGERS AMNIOINFUSION
INTRAVENOUS | Status: DC
  Administered 2018-02-24 (×2): via INTRAUTERINE
  Filled 2018-02-24: qty 1000

## 2018-02-24 MED ORDER — LIDOCAINE HCL (PF) 1 % IJ SOLN
INTRAMUSCULAR | Status: DC | PRN
  Administered 2018-02-24: 6 mL via EPIDURAL
  Administered 2018-02-24: 5 mL via EPIDURAL

## 2018-02-24 MED ORDER — COCONUT OIL OIL
1.0000 "application " | TOPICAL_OIL | Status: DC | PRN
  Administered 2018-02-25: 1 via TOPICAL
  Filled 2018-02-24: qty 120

## 2018-02-24 MED ORDER — DIPHENHYDRAMINE HCL 25 MG PO CAPS: 25.0000 mg | ORAL_CAPSULE | Freq: Four times a day (QID) | ORAL | Status: DC | PRN

## 2018-02-24 MED ORDER — EPHEDRINE 5 MG/ML INJ
10.0000 mg | INTRAVENOUS | Status: DC | PRN
  Filled 2018-02-24: qty 4
  Filled 2018-02-24: qty 2

## 2018-02-24 MED ORDER — WITCH HAZEL-GLYCERIN EX PADS: 1.0000 "application " | MEDICATED_PAD | CUTANEOUS | Status: DC | PRN

## 2018-02-24 MED ORDER — MEDROXYPROGESTERONE ACETATE 150 MG/ML IM SUSP: 150.0000 mg | INTRAMUSCULAR | Status: DC | PRN

## 2018-02-24 MED ORDER — DIBUCAINE 1 % RE OINT: 1.0000 "application " | TOPICAL_OINTMENT | RECTAL | Status: DC | PRN

## 2018-02-24 MED ORDER — DIPHENHYDRAMINE HCL 50 MG/ML IJ SOLN: 12.5000 mg | INTRAMUSCULAR | Status: DC | PRN

## 2018-02-24 MED ORDER — SENNOSIDES-DOCUSATE SODIUM 8.6-50 MG PO TABS
2.0000 | ORAL_TABLET | ORAL | Status: DC
  Administered 2018-02-24 – 2018-02-25 (×2): 2 via ORAL
  Filled 2018-02-24 (×2): qty 2

## 2018-02-24 MED ORDER — OXYTOCIN 40 UNITS IN LACTATED RINGERS INFUSION - SIMPLE MED
1.0000 m[IU]/min | INTRAVENOUS | Status: DC
  Administered 2018-02-24: 1 m[IU]/min via INTRAVENOUS

## 2018-02-24 MED ORDER — OXYCODONE HCL 5 MG PO TABS: 5.0000 mg | ORAL_TABLET | ORAL | Status: DC | PRN

## 2018-02-24 MED ORDER — TETANUS-DIPHTH-ACELL PERTUSSIS 5-2.5-18.5 LF-MCG/0.5 IM SUSP: 0.5000 mL | Freq: Once | INTRAMUSCULAR | Status: DC

## 2018-02-24 MED ORDER — FENTANYL 2.5 MCG/ML BUPIVACAINE 1/10 % EPIDURAL INFUSION (WH - ANES)
14.0000 mL/h | INTRAMUSCULAR | Status: DC | PRN
  Administered 2018-02-24 (×2): 14 mL/h via EPIDURAL
  Filled 2018-02-24 (×2): qty 100

## 2018-02-24 MED ORDER — LACTATED RINGERS AMNIOINFUSION
Freq: Once | INTRAVENOUS | Status: AC
  Administered 2018-02-24: 1 mL/h via INTRAUTERINE

## 2018-02-24 MED ORDER — EPHEDRINE 5 MG/ML INJ
10.0000 mg | INTRAVENOUS | Status: DC | PRN
  Administered 2018-02-24: 10 mg via INTRAVENOUS
  Filled 2018-02-24: qty 2

## 2018-02-24 MED ORDER — ZOLPIDEM TARTRATE 5 MG PO TABS: 5.0000 mg | ORAL_TABLET | Freq: Every evening | ORAL | Status: DC | PRN

## 2018-02-24 MED ORDER — PHENYLEPHRINE 40 MCG/ML (10ML) SYRINGE FOR IV PUSH (FOR BLOOD PRESSURE SUPPORT)
80.0000 ug | PREFILLED_SYRINGE | INTRAVENOUS | Status: DC | PRN
  Filled 2018-02-24: qty 10
  Filled 2018-02-24: qty 5

## 2018-02-24 MED ORDER — ACETAMINOPHEN 325 MG PO TABS: 650.0000 mg | ORAL_TABLET | ORAL | Status: DC | PRN

## 2018-02-24 NOTE — Progress Notes (Signed)
Shannon KaufmannShania Ware is a 22 y.o. G1P0 at 1659w6d by admitted for induction of labor due to preeclampsia per Dr. Mora ApplPinn.  Subjective: Patient is feeling mild cramping. RN called to say patient SROMed and on exam I could see light meconium stained fluid that confirms SROM. Patient is making cervical change and we will start pitocin.   Patient has now had two elevated blood pressures greater than 4 hours apart and can be diagnosed with gestational hypertension with proteinuria.   Objective: Vitals:   02/23/18 2201 02/23/18 2302 02/23/18 2357 02/24/18 0102  BP: (!) 132/95 123/75 128/86 132/81  Pulse: 85 75 79 78  Resp: 17 17 17 16   Temp:   98.4 F (36.9 C)   TempSrc:   Oral    FHT:  FHR: 140 bpm, variability: moderate,  accelerations:  Present,  decelerations:  Absent UC:   Irregular, uterine irritability  SVE:   Dilation: 3 Effacement (%): 50 Station: -2 Exam by:: Shannon FramesEllis Shloime Ware CNM  Labs: Lab Results  Component Value Date   WBC 15.3 (H) 02/23/2018   HGB 12.0 02/23/2018   HCT 37.2 02/23/2018   MCV 88.6 02/23/2018   PLT 299 02/23/2018    Assessment / Plan: Induction of labor due to gestational hypertension,  progressing well on pitocin  Labor: Progressing normally Preeclampsia:  no signs or symptoms of toxicity, intake and ouput balanced and labs stable Fetal Wellbeing:  Category I Pain Control:  Labor support without medications I/D:  n/a Anticipated MOD:  NSVD  Shannon Ware 02/24/2018, 1:04 AM

## 2018-02-24 NOTE — Anesthesia Procedure Notes (Signed)
Epidural Patient location during procedure: OB Start time: 02/24/2018 8:42 AM End time: 02/24/2018 8:45 AM  Staffing Anesthesiologist: Leilani AbleHatchett, Jarl Sellitto, MD Performed: anesthesiologist   Preanesthetic Checklist Completed: patient identified, site marked, surgical consent, pre-op evaluation, timeout performed, IV checked, risks and benefits discussed and monitors and equipment checked  Epidural Patient position: sitting Prep: site prepped and draped and DuraPrep Patient monitoring: continuous pulse ox and blood pressure Approach: midline Location: L3-L4 Injection technique: LOR air  Needle:  Needle type: Tuohy  Needle gauge: 17 G Needle length: 9 cm and 9 Needle insertion depth: 6 cm Catheter type: closed end flexible Catheter size: 19 Gauge Catheter at skin depth: 11 cm Test dose: negative and Other  Assessment Sensory level: T9 Events: blood not aspirated, injection not painful, no injection resistance, negative IV test and no paresthesia  Additional Notes Reason for block:procedure for pain

## 2018-02-24 NOTE — Anesthesia Pain Management Evaluation Note (Signed)
  [facility] Pain Management Visit Note  Patient: Shannon KaufmannShania Blizzard, 22 y.o., female  "Hello I am a member of the anesthesia team at [facility]. We have an anesthesia team available at all times to provide care throughout the hospital, including epidural management and anesthesia for C-section. I don't know your plan for the delivery whether it a natural birth, water birth, IV sedation, nitrous supplementation, doula or epidural, but we want to meet your pain goals."   1.Was your pain managed to your expectations on prior hospitalizations?   No prior hospitalizations  2.What is your expectation for pain management during this hospitalization?     Epidural  3.How can we help you reach that goal? Be available, epidural infusing  Record the patient's initial score and the patient's pain goal.   Pain: 3  Pain Goal: 5 [facility] wants you to be able to say your pain was always managed very well.  [facility]ITT,Jameon Deller 02/24/2018

## 2018-02-24 NOTE — Progress Notes (Signed)
Shannon Ware is a 22 y.o. G1P0 at 585w6d admitted for induction  Subjective: Called by Kriste BasqueBecky, RN d/t pt with recurrent decels (some late in timing) and requesting epidural.  Amnioinfusion already going since about 1:30am.    Objective: BP 124/66   Pulse 77   Temp 97.8 F (36.6 C) (Oral)   Resp 20   LMP 05/28/2017 Comment: Pt reports irregular periods   SpO2 97%  No intake/output data recorded. No intake/output data recorded.  FHT:  FHR: 140 bpm, variability: moderate,  accelerations:  Present,  decelerations:  Present  UC:   q 1-2 min SVE:   Dilation: 4 Effacement (%): 50 Station: -1 Exam by:: [place] McHenry RN  Labs: Lab Results  Component Value Date   WBC 17.7 (H) 02/24/2018   HGB 12.4 02/24/2018   HCT 38.5 02/24/2018   MCV 88.5 02/24/2018   PLT 279 02/24/2018    Assessment / Plan: Induction with cat 2 tracing desiring epidural.  S/p terbutaline for tachysystole earlier today with resolution of decels.  Tachysystole again now will give another dose of terb.  Pt may then sit up for epidural (with resolution of decels).  Will cont to observe.  Labor: Progressing normally Preeclampsia:  no signs or symptoms of toxicity Fetal Wellbeing:  Category II Pain Control:  anesthesia consult for epidural I/D:  GBS positive on pcn Anticipated MOD:  NSVD  [name] 02/24/2018, 8:18 AM

## 2018-02-24 NOTE — Progress Notes (Addendum)
Shannon KaufmannShania Ware is a 22 y.o. G1P0 at 5928w6d   Subjective: Called by L&D around 9am d/t recurrent decels to the 4570s.  BP reported as 130/70.  Pt was immediately s/p epidural. Amnioinfusion and FSE ordered. I was able to pull up tracing immediately after hanging up the phone and bradycardia to the 60s noted with frequent contractions.  I immediately called L&D back and asked them to have Faculty see the pt ASAP and give another dose of terbutaline.  Upon my arrival Dr. Alvester MorinNewton was in the room and tracing had returned to baseline.  She noted it started improving shortly after she got in the room.  With review of tracing phenylephrine was given as well d/t Bp dropped to 100s/60s after the reported BP of 130/70.    Objective: BP 123/71   Pulse 93   Temp 97.8 F (36.6 C) (Oral)   Resp 18   LMP 05/28/2017 Comment: Pt reports irregular periods   SpO2 100%  No intake/output data recorded. No intake/output data recorded.  FHT:  FHR: 140 bpm, variability: minimal - moderate,  accelerations:  Present,  decelerations:  Absent UC:   q 2-3 min with MVUs 160 SVE:   Dilation: 4.5 Effacement (%): 90 Station: 0 Exam by:: [email or web address]  Labs: Lab Results  Component Value Date   WBC 17.7 (H) 02/24/2018   HGB 12.4 02/24/2018   HCT 38.5 02/24/2018   MCV 88.5 02/24/2018   PLT 279 02/24/2018    Assessment / Plan: Spontaneous labor, progressing normally s/p cytotec only for induction.  S/p tachysystole and relative hypotension with resolution of FHT to baseline after phenylephrine and terbutaline.  Labor: Progressing normally.  Cont to observe. Preeclampsia:  no signs or symptoms of toxicity Fetal Wellbeing:  Category I  And Cat 2 Pain Control:  Epidural I/D:  GBS positive on PCN Anticipated MOD:  NSVD  Shannon Ware 02/24/2018, 9:38 AM

## 2018-02-24 NOTE — Progress Notes (Addendum)
Josem KaufmannShania Vankleeck is a 22 y.o. G1P0 at 6438w6d by admitted for induction of labor due to preeclampsia per Dr. Mora ApplPinn.  Subjective: Called by RN as patient began to have repetitive deep variable decelerations. They did not improve with repositioning and we were having difficulty tracing her contractions. IUPC placed and amnioinfusion started. Variables reduced but still present, patient noted to have tachysystole, terbutaline given. Will give baby time to recover and will consider starting pitocin per protocol as needed at that time.   Objective: Vitals:   02/23/18 2357 02/24/18 0102 02/24/18 0125 02/24/18 0126  BP: 128/86 132/81 112/71 112/71  Pulse: 79 78 87 87  Resp: 17 16 18 18   Temp: 98.4 F (36.9 C)     TempSrc: Oral     SpO2:    97%   FHT:  FHR: 130 bpm, variability: moderate,  accelerations:  Present,  decelerations:  Present repetitive deep variable decels now resolved UC:   Irregular SVE:   Dilation: 3 Effacement (%): 80 Station: -1 Exam by:: Kathalene FramesEllis Charnel Giles CNM  Labs: Lab Results  Component Value Date   WBC 15.3 (H) 02/23/2018   HGB 12.0 02/23/2018   HCT 37.2 02/23/2018   MCV 88.6 02/23/2018   PLT 299 02/23/2018    Assessment / Plan: Induction of labor due to gestational hypertension,  progressing well on pitocin  Labor: Progressing normally Preeclampsia:  no signs or symptoms of toxicity, intake and ouput balanced and labs stable Fetal Wellbeing:  Category II now category I  Pain Control:  Labor support without medications I/D:  n/a Anticipated MOD:  NSVD  [name] Yurem Viner 02/24/2018, 2:00 AM

## 2018-02-24 NOTE — Progress Notes (Signed)
Dr Su Hiltoberts updated on SVE, fhr decel to 60 with attempted push. Order received to start amnioinfusion 200cc bolus.

## 2018-02-24 NOTE — Progress Notes (Signed)
Dr Su Hiltoberts called, updated on FHR tracing, vbl decels, late decels, recurrent, ucs q1-3 minutes. Asked to review FHr tracing. Discussed. Uterine baseline at 50mmhg and IUPC has been flushed, adequate return per patient. Uterus palpates soft between uc but ucs with inadequate resting times. Will review tracing and call back with decision and plan for epidural/FHR management.

## 2018-02-24 NOTE — Progress Notes (Signed)
Shannon KaufmannShania Ware is a 22 y.o. G1P0 at 1166w6d by admitted for induction of labor due to preeclampsia per Dr. Mora Ware.  Subjective: Patient is sitting on ball this morning. She is feeling stronger contractions.   Objective: Vitals:   02/24/18 0257 02/24/18 0359 02/24/18 0505 02/24/18 0600  BP: 124/76 (!) 111/58 122/68 116/75  Pulse: 99 77 80 73  Resp: 18  19 18   Temp:  (!) 97 F (36.1 C)    TempSrc:  Axillary    SpO2:       FHT:  FHR: 130 bpm, variability: moderate,  accelerations:  Present,  decelerations:  Absent. Some loss of contact while patient it up on the ball but overall reassuring.  UC:   q2-443mins SVE:   Dilation: 4 Effacement (%): 50 Station: -1 Exam by:: Shannon McHenry RN  Labs: Lab Results  Component Value Date   WBC 15.3 (H) 02/23/2018   HGB 12.0 02/23/2018   HCT 37.2 02/23/2018   MCV 88.6 02/23/2018   PLT 299 02/23/2018    Assessment / Plan: Induction of labor due to preeclampsia per Dr. Mora Ware, progressing well after 1 dose cytotec  Labor: Progressing normally Preeclampsia:  no signs or symptoms of toxicity, intake and ouput balanced and labs stable Fetal Wellbeing:  Category I  Pain Control:  Labor support without medications I/D:  n/a Anticipated MOD:  NSVD  Shannon Ware  02/24/2018, 6:33 AM

## 2018-02-24 NOTE — Progress Notes (Signed)
Called to room due to fetal bradycardia and at request of provider who was en route. When I entered the patient had O2 running and terbutaline had been given as well as phenylephrine. She was lying in left lateral position and had FSE placed by RN. The FHR was in the 130s within seconds of me entering the room. I stayed at bedside until the patient's provider arrived. The FHR tracing was reassuring while I was present with (150/mod/no accels no decels). I provided warm hand off to patient's physician.   Federico FlakeKimberly Niles Lark Runk, MD, MPH, ABFM Attending Physician [facility]

## 2018-02-24 NOTE — Anesthesia Preprocedure Evaluation (Signed)
Anesthesia Evaluation  Patient identified by MRN, date of birth, ID band Patient awake    Reviewed: Allergy & Precautions, H&P , NPO status , Patient's Chart, lab work & pertinent test results  Airway Mallampati: I  TM Distance: >3 FB Neck ROM: full    Dental no notable dental hx. (+) Teeth Intact   Pulmonary former smoker,    Pulmonary exam normal breath sounds clear to auscultation       Cardiovascular negative cardio ROS Normal cardiovascular exam Rhythm:regular Rate:Normal     Neuro/Psych negative neurological ROS  negative psych ROS   GI/Hepatic negative GI ROS, Neg liver ROS,   Endo/Other  negative endocrine ROS  Renal/GU negative Renal ROS     Musculoskeletal   Abdominal Normal abdominal exam  (+)   Peds  Hematology negative hematology ROS (+)   Anesthesia Other Findings   Reproductive/Obstetrics (+) Pregnancy                             Anesthesia Physical Anesthesia Plan  ASA: II  Anesthesia Plan: Epidural   Post-op Pain Management:    Induction:   PONV Risk Score and Plan:   Airway Management Planned:   Additional Equipment:   Intra-op Plan:   Post-operative Plan:   Informed Consent: I have reviewed the patients History and Physical, chart, labs and discussed the procedure including the risks, benefits and alternatives for the proposed anesthesia with the patient or authorized representative who has indicated his/her understanding and acceptance.       Plan Discussed with:   Anesthesia Plan Comments:         Anesthesia Quick Evaluation  

## 2018-02-24 NOTE — Progress Notes (Signed)
Shannon KaufmannShania Ware is a 22 y.o. G1P0 at 5176w6d   Subjective: Pt seen and examined prior to going to surgery on another pt.  Exam was 7/90/0 to -1.  Amnioinfusion previously stopped.  MVUs not adequate at the time but decision made to observe given the cervical change.  Tracing at that time which was around 11:15am was cat 1. Pt currently resting comfortable.  No complaints. S/p exam by Shannon BasqueBecky, RN and exam is unchanged.  Objective: BP 122/79   Pulse 92   Temp 98 F (36.7 C) (Axillary)   Resp 18   LMP 05/28/2017 Comment: Pt reports irregular periods   SpO2 95%  No intake/output data recorded. No intake/output data recorded.  FHT:  FHR: 140 bpm, variability: minimal-moderate ,  accelerations:  Present,  decelerations:  Absent UC:   irregular SVE:   Dilation: 7 Effacement (%): 100 Station: 0 Exam by:: [email or web address]  Labs: Lab Results  Component Value Date   WBC 17.7 (H) 02/24/2018   HGB 12.4 02/24/2018   HCT 38.5 02/24/2018   MCV 88.5 02/24/2018   PLT 279 02/24/2018    Assessment / Plan: Arrest in active phase of labor at 7cm with inadequate MVUs.  I discussed options with patient and recommendations.  Pt is agreeable to proceed with pitocin augmentation.  R/b/a reviewed.  Labor: start low dose pitocin at 311mu/min and increase by 311mu/min Preeclampsia:  no signs or symptoms of toxicity Fetal Wellbeing:  Category I - Category 2 Pain Control:  Epidural I/D:  GBS + on PCN Anticipated MOD:  NSVD  Shannon Ware 02/24/2018, 2:11 PM

## 2018-02-25 LAB — CBC
HEMATOCRIT: 32.2 % — AB (ref 36.0–46.0)
Hemoglobin: 10.4 g/dL — ABNORMAL LOW (ref 12.0–15.0)
MCH: 28.2 pg (ref 26.0–34.0)
MCHC: 32.3 g/dL (ref 30.0–36.0)
MCV: 87.3 fL (ref 80.0–100.0)
NRBC: 0 % (ref 0.0–0.2)
PLATELETS: 240 10*3/uL (ref 150–400)
RBC: 3.69 MIL/uL — AB (ref 3.87–5.11)
RDW: 13.1 % (ref 11.5–15.5)
WBC: 19.2 10*3/uL — AB (ref 4.0–10.5)

## 2018-02-25 NOTE — Lactation Note (Addendum)
This note was copied from a baby's chart. Lactation Consultation Note  Patient Name: Shannon Ware ZOXWR'UToday's Date: 02/25/2018 Reason for consult: Initial assessment;1st time breastfeeding;Early term 37-38.6wks P1, 9 hour Josem Kaufmannfemale infant. Per mom. She  did not attend any BF classes in her pregnancy.  Per mom, she feels breastfeeding is going well. [facility] [place] Hand pump (harmony) [facility] and explained how to assemble, reassemble, clean and store breast milk.  LC entered room, mom was BF infant on right breast using the cradle hold, infant  with a wide mouth gape and lower jaw extended, swallowing was heard by LC. Mom did not need any assistance with latching infant to breast and mom was still BF as LC left room. Mom had been breastfeeding for 20 minutes. LC notice mom has flat nipples that are short shafted . LC gave mom breast shells to wear to help elongate and extend nipple shaft out more as well as pre-pumping prior to infant latching to breast. LC help assist  Mom in  pre-pump opposite breast while breastfeeding. Per mom, she notice a big difference and plans to pre-pump before latching infant to breast. Mom was fitted with 27 flange.  LC discussed I & O. Reviewed Baby & Me book's Breastfeeding Basics.  Mom made aware of O/P services, breastfeeding support groups, community resources, and our phone # for post-discharge questions.   Maternal Data Formula Feeding for Exclusion: No Has patient been taught Hand Expression?: Yes(Colostrum present both breast.) Does the patient have breastfeeding experience prior to this delivery?: No  Feeding Feeding Type: Breast Fed  LATCH Score Latch: Grasps breast easily, tongue down, lips flanged, rhythmical sucking.  Audible Swallowing: Spontaneous and intermittent  Type of Nipple: Flat  Comfort (Breast/Nipple): Soft / non-tender  Hold (Positioning): No assistance needed to correctly position infant at  breast.  LATCH Score: 9  Interventions Interventions: Breast feeding basics reviewed;Skin to skin;Shells;Hand pump;Hand express  Lactation Tools Discussed/Used Tools: Shells;Flanges Flange Size: 27 Shell Type: (flat and short shafted.) Pump Review: Setup, frequency, and cleaning;Milk Storage Initiated by:: Danelle Earthlyobin Gissell Barra, IBCLC Date initiated:: 02/25/18   Consult Status Consult Status: Follow-up Date: 02/26/18 Follow-up type: In-patient    Danelle EarthlyRobin Kensi Karr 02/25/2018, 2:57 AM

## 2018-02-25 NOTE — Anesthesia Postprocedure Evaluation (Signed)
Anesthesia Post Note  Patient: Shannon Ware  Procedure(s) Performed: AN AD HOC LABOR EPIDURAL     Patient location during evaluation: Mother Baby Anesthesia Type: Epidural Level of consciousness: awake Pain management: pain level controlled Vital Signs Assessment: post-procedure vital signs reviewed and stable Respiratory status: spontaneous breathing Cardiovascular status: stable Postop Assessment: no backache, epidural receding, patient able to bend at knees and no headache Anesthetic complications: no    Last Vitals:  Vitals:   02/25/18 0052 02/25/18 0506  BP: 129/76 122/71  Pulse: (!) 111 93  Resp: 16 18  Temp: 37.8 C 37.1 C  SpO2: 99% 100%    Last Pain:  Vitals:   02/25/18 0506  TempSrc: Oral  PainSc: 0-No pain   Pain Goal:                 [name],[name]

## 2018-02-25 NOTE — Lactation Note (Addendum)
This note was copied from a baby's chart. Lactation Consultation Note  Patient Name: Girl Josem KaufmannShania Mauritz ZOXWR'UToday's Date: 02/25/2018 Reason for consult: Follow-up assessment   P1, Baby 26 hours old and latching upon entering after a long off and on feeding. Assisted w/ baby latching deeper on the breast by compressing breast. Noted mother pulling tissue back from infant's nose.  Provided education. Mother states baby has been cluster feeding.  Encouraged depth and compression for increased transfer. Mother states her R nipple is tender.  No cracks or abrasion.  Encouraged applying ebm and provided coconut oil. Reminded mother to use hand pump to prepump before latching, unlatch if shallow and relatch and pump on R if too sore to latch. Mom encouraged to feed baby 8-12 times/24 hours and with feeding cues.      Maternal Data    Feeding Feeding Type: Breast Fed  LATCH Score Latch: Grasps breast easily, tongue down, lips flanged, rhythmical sucking.  Audible Swallowing: A few with stimulation  Type of Nipple: Everted at rest and after stimulation  Comfort (Breast/Nipple): Filling, red/small blisters or bruises, mild/mod discomfort  Hold (Positioning): No assistance needed to correctly position infant at breast.  LATCH Score: 8  Interventions Interventions: Breast feeding basics reviewed;Assisted with latch;Coconut oil  Lactation Tools Discussed/Used     Consult Status Consult Status: Follow-up Date: 02/26/18 Follow-up type: In-patient    Dahlia ByesBerkelhammer, Maury [facility] 02/25/2018, 7:54 PM

## 2018-02-25 NOTE — Progress Notes (Signed)
Subjective: Postpartum Day # 2 : S/P NSVD due to presenting for induction of labor for preeclampsia per Dr. Mora ApplPinn. Patient up ad lib, denies syncope or dizziness. Reports consuming regular diet without issues and denies N/V. Patient reports 0 bowel movement + passing flatus.  Denies issues with urination and reports bleeding is "lighter."  Patient is Breastfeeding and reports going well.  Desires nexplanon for postpartum contraception.  Pain is being appropriately managed with use of po pain meds. Pt wants to be discharged to home in the morning.   No laceration Feeding:  Breast Contraceptive plan:  Nexplanon Baby Female.   Objective: Vital signs in last 24 hours: Patient Vitals for the past 24 hrs:  BP Temp Temp src Pulse Resp SpO2  02/25/18 1448 127/85 - - 88 18 -  02/25/18 0850 135/90 98.7 F (37.1 C) Oral 88 18 100 %  02/25/18 0506 122/71 98.7 F (37.1 C) Oral 93 18 100 %  02/25/18 0052 129/76 100.1 F (37.8 C) Oral (!) 111 16 99 %  02/24/18 2056 (!) 142/71 98.6 F (37 C) Oral (!) 118 18 100 %  02/24/18 2000 131/70 99.4 F (37.4 C) Oral (!) 105 18 96 %  02/24/18 1902 (!) 126/55 - - 90 18 -  02/24/18 1846 121/65 - - 99 20 -  02/24/18 1831 122/76 - - (!) 102 18 -  02/24/18 1817 120/71 - - 99 20 -  02/24/18 1803 130/88 - - (!) 103 18 -  02/24/18 1747 (!) 109/32 - - 94 20 -  02/24/18 1732 (!) 110/31 97.8 F (36.6 C) Oral (!) 106 18 -     Physical Exam:  General: alert, cooperative, appears stated age and no distress Mood/Affect: Happy Lungs: clear to auscultation, no wheezes, rales or rhonchi, symmetric air entry.  Heart: normal rate, regular rhythm, normal S1, S2, no murmurs, rubs, clicks or gallops. Breast: breasts appear normal, no suspicious masses, no skin or nipple changes or axillary nodes. Abdomen:  + bowel sounds, soft, non-tender GU: perineum intact, healing well. No signs of external hematomas.  Uterine Fundus: firm Lochia: appropriate Skin: Warm, Dry. DVT  Evaluation: No evidence of DVT seen on physical exam. Negative Homan's sign. No cords or calf tenderness. No significant calf/ankle edema.  CBC Latest Ref Rng & Units 02/25/2018 02/24/2018 02/23/2018  WBC 4.0 - 10.5 K/uL 19.2(H) 17.7(H) 15.3(H)  Hemoglobin 12.0 - 15.0 g/dL 10.4(L) 12.4 12.0  Hematocrit 36.0 - 46.0 % 32.2(L) 38.5 37.2  Platelets 150 - 400 K/uL 240 279 299    Results for orders placed or performed during the hospital encounter of 02/23/18 (from the past 24 hour(s))  CBC     Status: Abnormal   Collection Time: 02/25/18  5:14 AM  Result Value Ref Range   WBC 19.2 (H) 4.0 - 10.5 K/uL   RBC 3.69 (L) 3.87 - 5.11 MIL/uL   Hemoglobin 10.4 (L) 12.0 - 15.0 g/dL   HCT 16.132.2 (L) 09.636.0 - 04.546.0 %   MCV 87.3 80.0 - 100.0 fL   MCH 28.2 26.0 - 34.0 pg   MCHC 32.3 30.0 - 36.0 g/dL   RDW 40.913.1 81.111.5 - 91.415.5 %   Platelets 240 150 - 400 K/uL   nRBC 0.0 0.0 - 0.2 %     CBG (last 3)  No results for input(s): GLUCAP in the last 72 hours.   I/O last 3 completed shifts: In: -  Out: 1350 [Urine:1300; Blood:50]   Assessment Postpartum Day # 2 : S/P NSVD  due to presenting for induction of labor for preeclampsia per Dr. Mora Appl. Pt stable. -2 involution. Breastfeeding. Hemodynamically stable.   Plan: Continue other mgmt as ordered VTE prophylactics: Early ambulated as tolerates.  Pain control: Motrin/Tylenol PRN Education given regarding options for contraception, including barrier methods, injectable contraception, IUD placement, oral contraceptives.  Plan for discharge tomorrow, Breastfeeding, Lactation consult and Contraception Nexplanon Dr. Estanislado Pandy to be updated on patient status  Toby Ayad NP-C, CNM 02/25/2018, 5:27 PM Late entry

## 2018-02-26 MED ORDER — IBUPROFEN 600 MG PO TABS: 600.0000 mg | ORAL_TABLET | Freq: Four times a day (QID) | ORAL | 0 refills | Status: DC

## 2018-02-26 NOTE — Lactation Note (Signed)
This note was copied from a baby's chart. Lactation Consultation Note  Patient Name: Shannon Ware ZOXWR'UToday's Date: 02/26/2018 Reason for consult: Follow-up assessment  Mom reports right nipple is extremely sore and she has not wanted to feed on that side. Mom reports she pumped on that side last time and did not breastfeed from it.  Did not get anything when she pumped. Infant with no stool in close to 24 hours.  Discussed with mom making sure to hand express and spoon feed back all EBM past breastfeeds.  Assisted with breastfeeding on right breast. Discussed how she may need to shape breast to help her get more tissue in mouth.  Assist in football.  Mom reports comfort.  After infant latched/mom let her hand go.  Infant still seems to be attached to breast well. And mom still reported comfort.  Infant with few swallows heard.  Urged mom to start hand expressing and using DEBP past breastfeeds at the 2-3 hour mark and feeding her all she can get out and all she will take.  Mom concerned about going home.  Will visit mom PRN or follow up with her this pm to see how breastfeeding is going.  Maternal Data Has patient been taught Hand Expression?: Yes  Feeding Feeding Type: Breast Fed  LATCH Score Latch: Grasps breast easily, tongue down, lips flanged, rhythmical sucking.  Audible Swallowing: A few with stimulation  Type of Nipple: Everted at rest and after stimulation  Comfort (Breast/Nipple): Soft / non-tender(mom reprts right nipple painful/intact)  Hold (Positioning): Assistance needed to correctly position infant at breast and maintain latch.  LATCH Score: 8  Interventions    Lactation Tools Discussed/Used     Consult Status Consult Status: Follow-up Date: 02/26/18 Follow-up type: In-patient    Shannon Ware 02/26/2018, 10:07 AM

## 2018-02-26 NOTE — Discharge Summary (Signed)
OB Discharge Summary Postpartum Day # 2 : S/P NSVD due to presenting for induction of labor for preeclampsia per Dr. Mora ApplPinn. Patient moving well, denies syncope or dizziness. Denies N/V. Patient reports 0 bowel movement + passing flatus.  Denies issues with urination and reports bleeding is light.    Patient Name: Shannon Ware DOB: 08/17/1995 MRN: [id number]  Date of admission: 02/23/2018 Delivering MD: Osborn CohoOBERTS, ANGELA   Date of discharge: 02/26/2018  Admitting diagnosis: 38wks direct admit Intrauterine pregnancy: 3029w6d     Secondary diagnosis:  Active Problems:   Proteinuria affecting pregnancy in third trimester   Vaginal delivery  Additional problems: Preeclampsia     Discharge diagnosis: Term Pregnancy Delivered                                                                                                Post partum procedures:none  Augmentation: Pitocin and Cytotec  Complications: None  Hospital course:  Induction of Labor With Vaginal Delivery   22 y.o. yo G1P1001 at 2429w6d was admitted to the hospital 02/23/2018 for induction of labor.  Indication for induction: Preeclampsia.  Patient had an uncomplicated labor course as follows: Membrane Rupture Time/Date: 12:20 AM ,02/24/2018   Intrapartum Procedures: Episiotomy: None [1]                                         Lacerations:  None [1]  Patient had delivery of a Viable infant.  Information for the patient's newborn:  Gloriann LoanMcLaurin, Girl Winnie [[id number]][[id number]]  Delivery Method: Vaginal, Spontaneous(Filed from Delivery Summary)   02/24/2018  Details of delivery can be found in separate delivery note.  Patient had a routine postpartum course. Patient is discharged home 02/26/18.  Physical exam  Vitals:   02/25/18 0850 02/25/18 1448 02/25/18 2117 02/26/18 0525  BP: 135/90 127/85 120/68 117/78  Pulse: 88 88 96 75  Resp: 18 18 18 16   Temp: 98.7 F (37.1 C)  98.3 F (36.8 C) 97.7 F (36.5 C)  TempSrc: Oral  Oral Oral   SpO2: 100%  100% 100%   General: alert, cooperative and no distress Lochia: appropriate Uterine Fundus: firm Incision: N/A DVT Evaluation: No evidence of DVT seen on physical exam. Labs: Lab Results  Component Value Date   WBC 19.2 (H) 02/25/2018   HGB 10.4 (L) 02/25/2018   HCT 32.2 (L) 02/25/2018   MCV 87.3 02/25/2018   PLT 240 02/25/2018   CMP Latest Ref Rng & Units 02/23/2018  Glucose 70 - 99 mg/dL 295(M128(H)  BUN 6 - 20 mg/dL 12  Creatinine 8.410.44 - 3.241.00 mg/dL 4.010.58  Sodium 027135 - 253145 mmol/L 135  Potassium 3.5 - 5.1 mmol/L 4.2  Chloride 98 - 111 mmol/L 103  CO2 22 - 32 mmol/L 22  Calcium 8.9 - 10.3 mg/dL 9.2  Total Protein 6.5 - 8.1 g/dL 6.7  Total Bilirubin 0.3 - 1.2 mg/dL 6.6(Y0.2(L)  Alkaline Phos 38 - 126 U/L 167(H)  AST 15 - 41 U/L 25  ALT 0 - 44  U/L 19    Discharge instruction: per After Visit Summary and "Baby and Me Booklet".  After visit meds:  Allergies as of 02/26/2018   No Known Allergies     Medication List    TAKE these medications   ibuprofen 600 MG tablet Commonly known as:  ADVIL,MOTRIN Take 1 tablet (600 mg total) by mouth every 6 (six) hours.   prenatal multivitamin Tabs tablet Take 1 tablet by mouth daily at 12 noon.       Diet: routine diet  Activity: Advance as tolerated. Pelvic rest for 6 weeks.   Outpatient follow up:6 weeks, [facility] RN at 1 week for B/P check Follow up Appt:No future appointments. Follow up Visit:No follow-ups on file.  Postpartum contraception: Nexplanon  Newborn Data: Live born female  Birth Weight: 6 lb 4.9 oz (2860 g) APGAR: 7, 9  Newborn Delivery   Birth date/time:  02/24/2018 17:14:00 Delivery type:  Vaginal, Spontaneous     Baby Feeding: Breast Disposition:staying an extra day to receive additional support from lactation   02/26/2018 Jonetta Speak, CNM

## 2018-02-27 ENCOUNTER — Ambulatory Visit: Payer: Self-pay

## 2018-02-27 NOTE — Lactation Note (Signed)
This note was copied from a baby's chart. Lactation Consultation Note Baby 6457 hrs old. Noted baby having decreased output. Only 1 stool after birth. LC changed diaper baby had (50 cent piece) size soft brown stool. Mom excited.  Noted mom's breast filling. Mom has short shaft nipples. Mom states they are very sore. Hurts so bad at times she can't stand to feed but she does anyway. Discussed w/mom the need to supplement after BF d/t wt. Loss and decreased out put. Mom stated OK. Gave mom supplementation sheet at hours of age after BF baby would need 18-25 ml. Of course BM is preferred and to be given first, and if not enough may need formula until mom's milk volume increases. Mom agreed.  Discussed supplement using curve tip syring. Explained how to, encouraged mom to call for RN to assist when ready to supplement.  Mom shown how to use DEBP & how to disassemble, clean, & reassemble parts. Mom knows to pump q3h for 15-20 min. Since mom's breast are filling, encouraged mom to pump when breast are filling heavy even if sooner than 3 hrs. Mom pumped collected 10 ml.  Baby unable to obtain deep latch d/t short shaft nipples, breast bouncy not as compressible. Mom's nipples bruised and tender. Fitted mom w/#24 NS. Baby didn't latch well, to large. #20 NS fit well. Mom demonstrated application of NS. Discussed latching positions, support, comfort, breast massage and milk storage.  Baby latched great to #20 NS. Heard frequent swallowing. Mom excited.  Mom has carpal tunnel in her hand, they go to sleep easily. Demonstrated how to place support to hold baby's head to obtain close deep latch. Comfort gels and shells given. Mom states has good understanding of feeding plan.  Reported to RN Mom knows to call for further questions or concerns. Patient Name: Girl Josem KaufmannShania Salvia WUJWJ'XToday's Date: 02/27/2018 Reason for consult: Follow-up assessment;Nipple pain/trauma;1st time breastfeeding;Infant < 6lbs   Maternal  Data    Feeding Feeding Type: Breast Fed  LATCH Score Latch: Grasps breast easily, tongue down, lips flanged, rhythmical sucking.  Audible Swallowing: Spontaneous and intermittent  Type of Nipple: Everted at rest and after stimulation(very short shaft)  Comfort (Breast/Nipple): Filling, red/small blisters or bruises, mild/mod discomfort  Hold (Positioning): Assistance needed to correctly position infant at breast and maintain latch.  LATCH Score: 8  Interventions    Lactation Tools Discussed/Used Tools: Shells;Pump;Comfort gels;Nipple Shields Nipple shield size: 20;24 Flange Size: 24 Shell Type: Inverted Breast pump type: Double-Electric Breast Pump;Manual WIC Program: Yes Pump Review: Setup, frequency, and cleaning;Milk Storage Initiated by:: Peri JeffersonL. Sabreena Vogan RN IBCLC Date initiated:: 02/27/18   Consult Status Consult Status: Follow-up Date: 02/27/18 Follow-up type: In-patient    Ayeden Gladman, Diamond NickelLAURA G 02/27/2018, 3:06 AM

## 2018-02-27 NOTE — Lactation Note (Signed)
This note was copied from a baby's chart. Lactation Consultation Note  Patient Name: Shannon Ware ZOXWR'UToday's Date: 02/27/2018 Reason for consult: Follow-up assessment;Nipple pain/trauma Mom is putting baby to breast and then supplementing with formula using curved tip syringe.  Baby is 6564 hours old and at a 7% weight loss.  Breasts are soft this morning.  She recently pumped but did not obtain any colostrum.  Stressed importance of pumping every 3 hours and giving expressed milk back to baby.  [facility]  Lactation outpatient services and support information reviewed and encouraged prn.  Maternal Data    Feeding Feeding Type: Formula  LATCH Score                   Interventions    Lactation Tools Discussed/Used     Consult Status Consult Status: Complete Follow-up type: Call as needed    Huston FoleyMOULDEN, Horatio Bertz S 02/27/2018, 9:18 AM

## 2018-09-17 IMAGING — US US OB COMP LESS 14 WK
1 series · 14 of 28 positions shown · non-contrast
Comparison: None.

CLINICAL DATA: Pelvic pain for 3 days.  Unknown LMP.

EXAM:
OBSTETRIC <14 WK US AND TRANSVAGINAL OB US
TECHNIQUE: Both transabdominal and transvaginal ultrasound examinations were
performed for complete evaluation of the gestation as well as the
maternal uterus, adnexal regions, and pelvic cul-de-sac.
Transvaginal technique was performed to assess early pregnancy.

[Series 1: us ob comp less 14 wk · 0.17mm/px · 70 acquisitions, 14 frames shown]
[im 3/70]
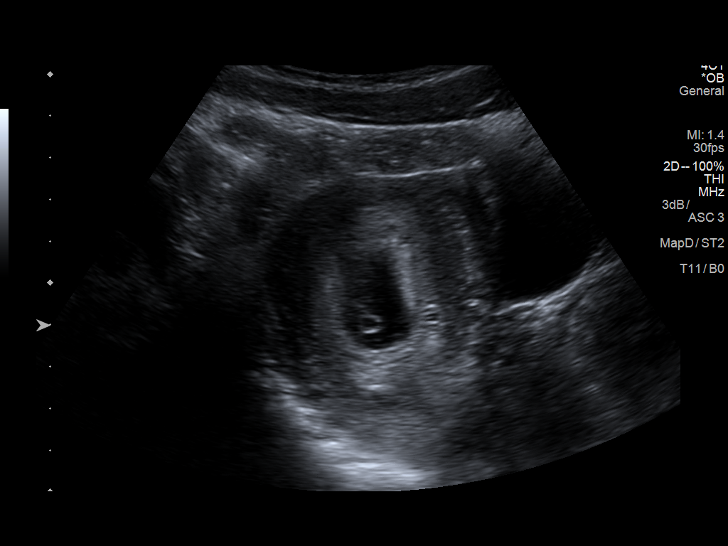
[im 8/70]
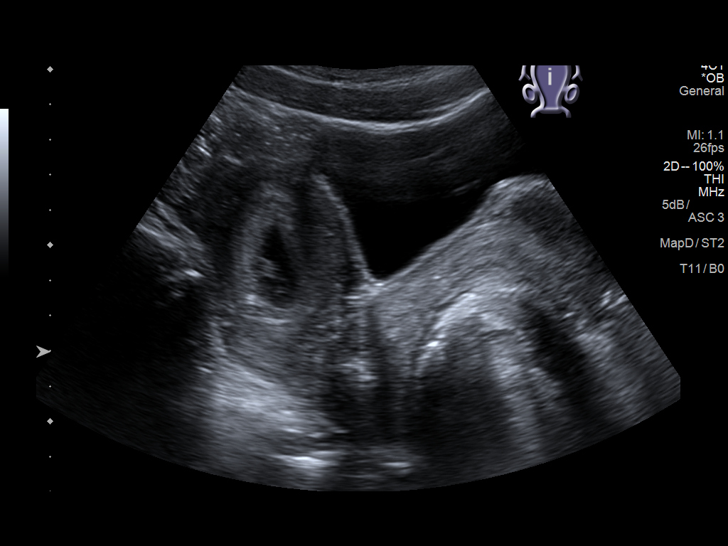
[im 13/70]
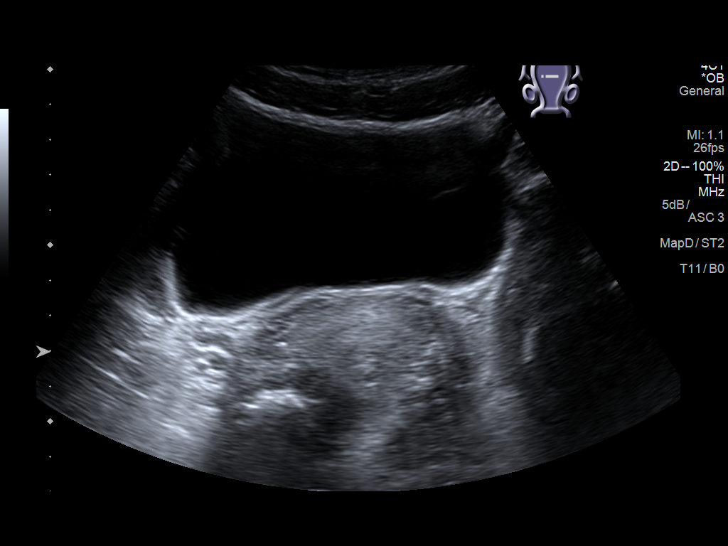
[im 18/70]
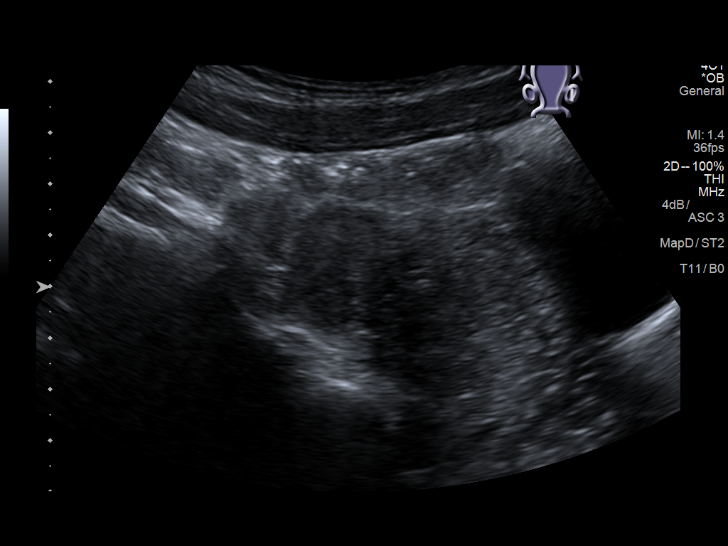
[im 24/70]
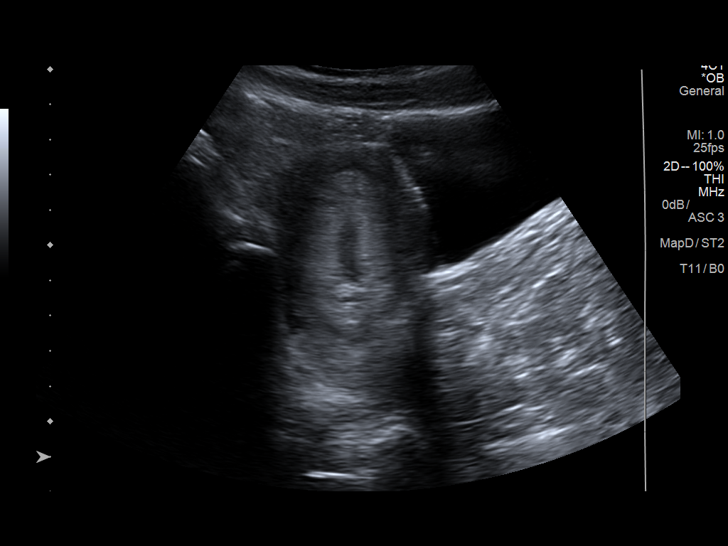
[im 29/70]
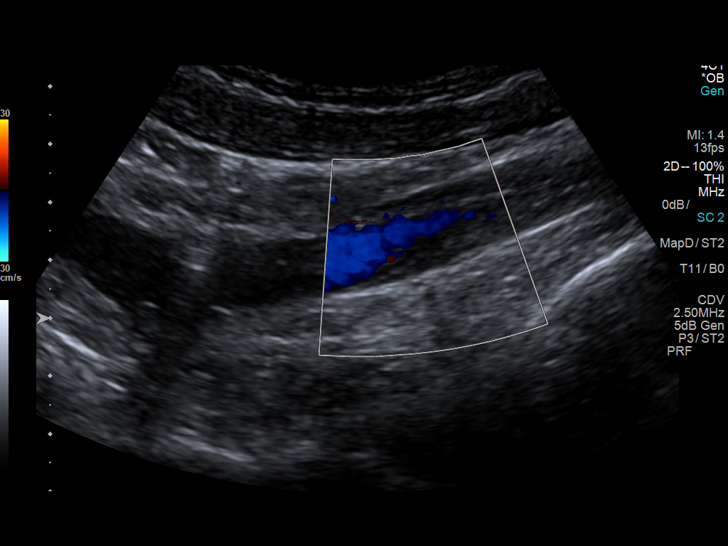
[im 34/70]
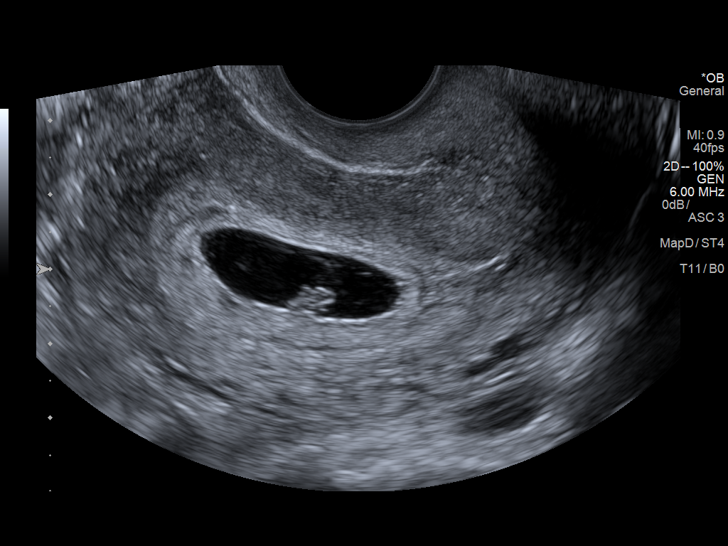
[im 39/70]
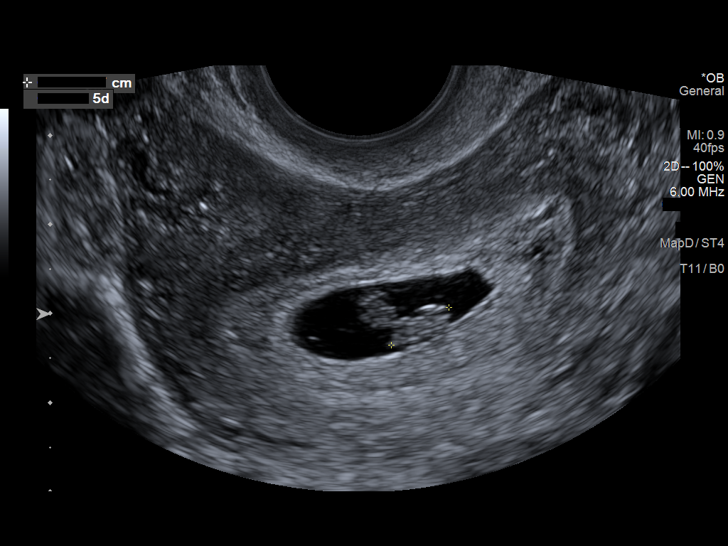
[im 44/70]
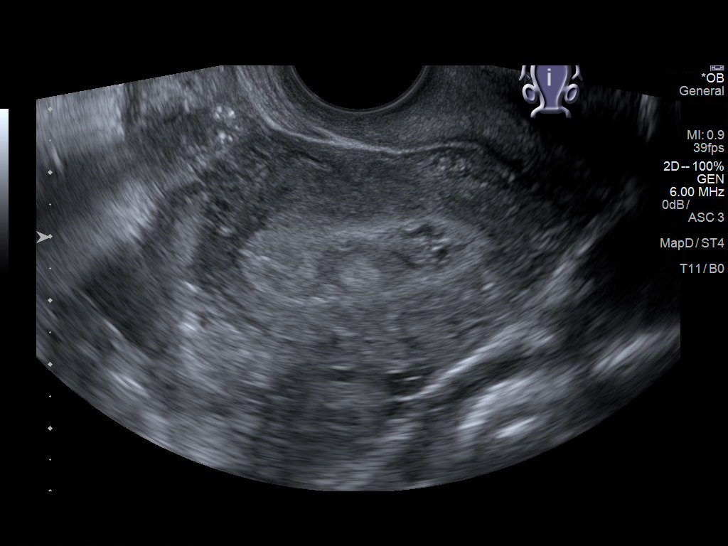
[im 49/70]
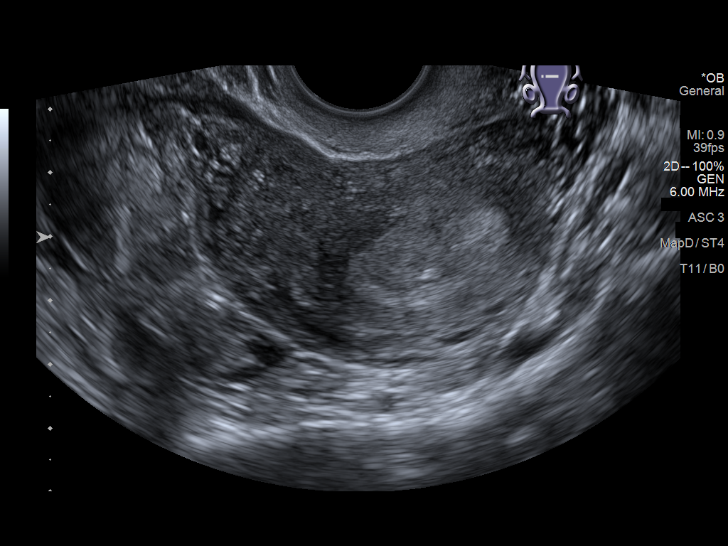
[im 54/70]
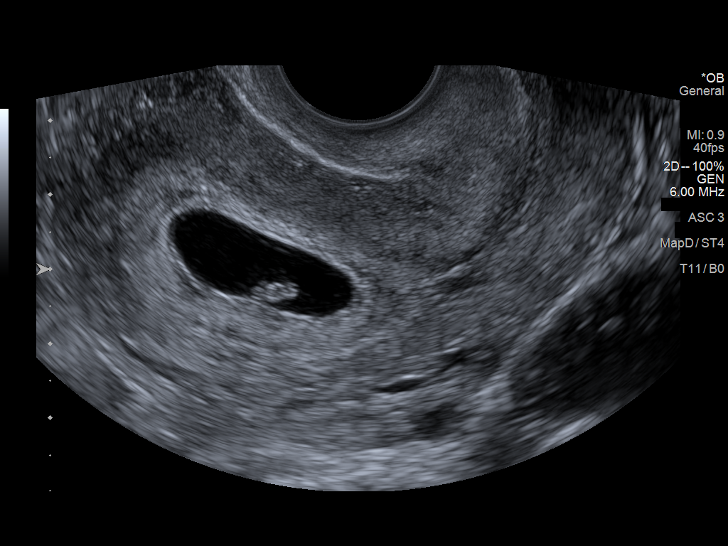
[im 59/70]
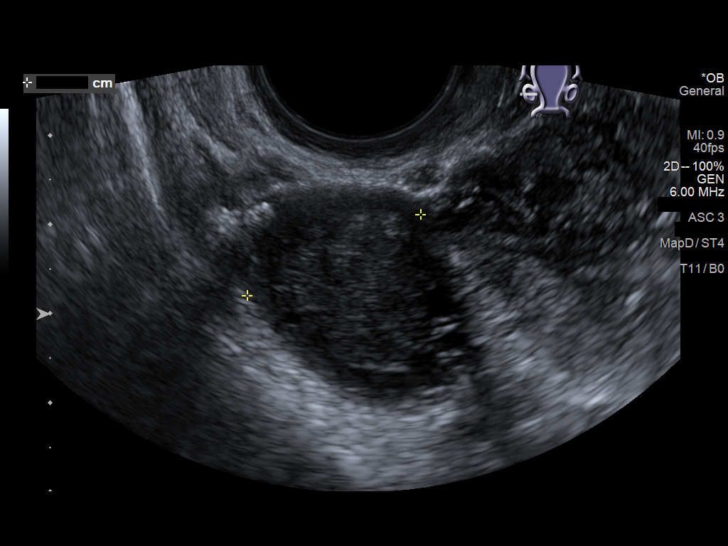
[im 64/70]
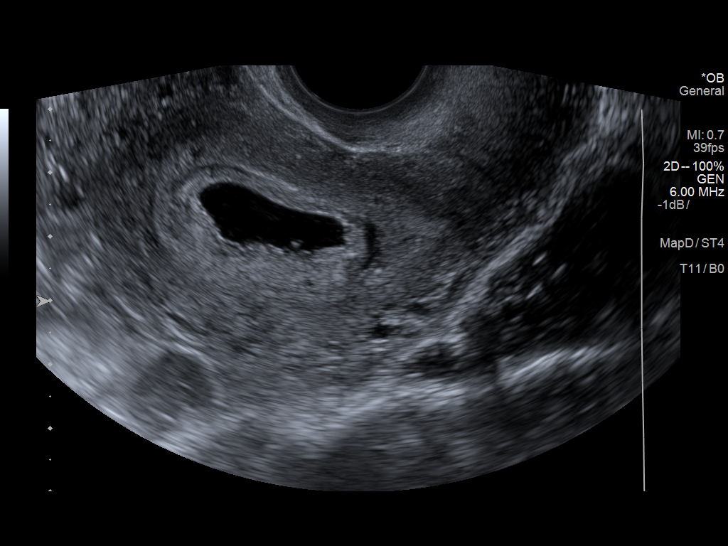
[im 70/70]
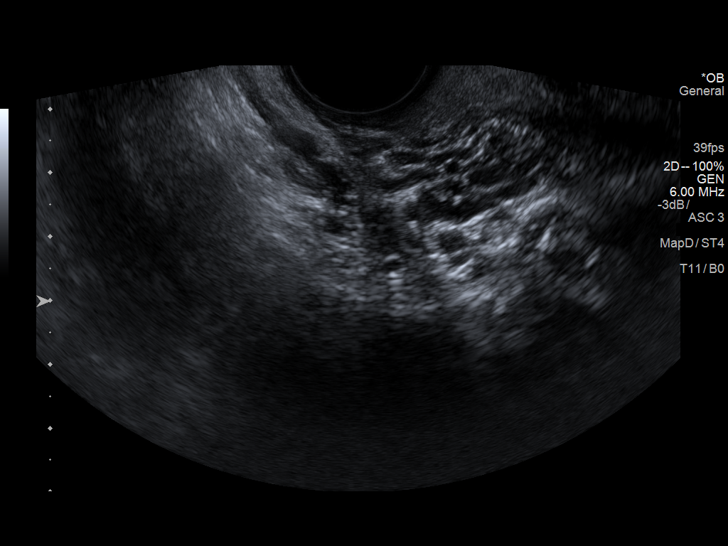

[14 of 28 positions shown; findings below may reference images not displayed]

FINDINGS: Intrauterine gestational sac: Single

Yolk sac:  Visualized.

Embryo:  Visualized.

Cardiac Activity: Visualized.

Heart Rate: 145 bpm

CRL:  8 mm   6 w   5 d                  US EDC: 03/05/2018

Subchorionic hemorrhage:  None visualized.

Maternal uterus/adnexae: Both ovaries are normal in appearance. No
mass or abnormal free fluid identified.
IMPRESSION: Single living IUP measuring 6 weeks 5 days, with US EDC of
03/05/2018.

No significant maternal uterine or adnexal abnormality identified.

## 2022-04-03 ENCOUNTER — Encounter (HOSPITAL_COMMUNITY): Payer: Self-pay

## 2022-04-03 ENCOUNTER — Emergency Department (HOSPITAL_COMMUNITY)
Admission: EM | Admit: 2022-04-03 | Discharge: 2022-04-03 | Discharge status: Against Medical Advice | Payer: Medicaid Other | Attending: Emergency Medicine | Admitting: Emergency Medicine

## 2022-04-03 ENCOUNTER — Other Ambulatory Visit: Payer: Self-pay

## 2022-04-03 ENCOUNTER — Telehealth: Payer: Medicaid Other | Admitting: Urgent Care

## 2022-04-03 DIAGNOSIS — N760 Acute vaginitis: Secondary | ICD-10-CM | POA: Diagnosis not present

## 2022-04-03 DIAGNOSIS — Z5321 Procedure and treatment not carried out due to patient leaving prior to being seen by health care provider: Secondary | ICD-10-CM | POA: Insufficient documentation

## 2022-04-03 DIAGNOSIS — N898 Other specified noninflammatory disorders of vagina: Secondary | ICD-10-CM | POA: Diagnosis not present

## 2022-04-03 LAB — URINALYSIS, ROUTINE W REFLEX MICROSCOPIC
Bilirubin Urine: NEGATIVE
Glucose, UA: NEGATIVE mg/dL
Ketones, ur: NEGATIVE mg/dL
Nitrite: NEGATIVE
Protein, ur: NEGATIVE mg/dL
Specific Gravity, Urine: 1.028 (ref 1.005–1.030)
pH: 5 (ref 5.0–8.0)

## 2022-04-03 LAB — PREGNANCY, URINE: Preg Test, Ur: NEGATIVE

## 2022-04-03 NOTE — ED Provider Triage Note (Signed)
Emergency Medicine Provider Triage Evaluation Note  Shannon Ware , a 27 y.o. female  was evaluated in triage.  Pt complains of  yellowish vaginal discharge and vaginal itching for the last 7 days.  She has been recently treated for yeast infection with similar symptoms.  She denies any abdominal pain or rash around her genital area.  No fever.  Review of Systems  Positive: As above Negative: As above  Physical Exam  BP (!) 138/100 (BP Location: Left Arm)   Pulse 67   Temp 98.2 F (36.8 C) (Oral)   Resp 14   Ht 5\' 2"  (1.575 m)   Wt 79.4 kg   SpO2 100%   BMI 32.01 kg/m  Gen:   Awake, no distress   Resp:  Normal effort  MSK:   Moves extremities without difficulty  Other:    Medical Decision Making  Medically screening exam initiated at 10:19 AM.  Appropriate orders placed.  Ciarrah Rae was informed that the remainder of the evaluation will be completed by another provider, this initial triage assessment does not replace that evaluation, and the importance of remaining in the ED until their evaluation is complete.     Rex Kras, [place] 04/03/22 [phone]

## 2022-04-03 NOTE — ED Triage Notes (Signed)
Patient c/o yellow vaginal discharge  and itchingx 7 days.  Patient denies any lower abdominal pain.

## 2022-04-03 NOTE — Patient Instructions (Signed)
  Mamie Levers, thank you for joining Chaney Malling, PA for today's virtual visit.  While this provider is not your primary care provider (PCP), if your PCP is located in our provider database this encounter information will be shared with them immediately following your visit.   [facility] account gives you access to today's visit and all your visits, tests, and labs performed at [facility] " click here if you don't have a [facility] account or go to [email or web address]  Consent: (Patient) Sayre Mazor provided verbal consent for this virtual visit at the beginning of the encounter.  Current Medications:  Current Outpatient Medications:    ibuprofen (ADVIL,MOTRIN) 600 MG tablet, Take 1 tablet (600 mg total) by mouth every 6 (six) hours., Disp: 30 tablet, Rfl: 0   Prenatal Vit-Fe Fumarate-FA (PRENATAL MULTIVITAMIN) TABS tablet, Take 1 tablet by mouth daily at 12 noon., Disp: , Rfl:    Medications ordered in this encounter:  No orders of the defined types were placed in this encounter.    *If you need refills on other medications prior to your next appointment, please contact your pharmacy*  Follow-Up: Call back or seek an in-person evaluation if the symptoms worsen or if the condition fails to improve as anticipated.  [place] [phone]  Other Instructions Your symptoms are most concerning for possible trichomonas, which is an STI. We cannot treat STIs virtually. Please keep your appointment tomorrow morning to perform the appropriate diagnostic tests required. Additionally, please do not engage in any forms of sexual intercourse until testing and treatment has been completed.   If you have been instructed to have an in-person evaluation today at a local Urgent Care facility, please use the link below. It will take you to a list of all of our available [facility], including address, phone number and hours  of operation. Please do not delay care.  [facility]  If you or a family member do not have a primary care provider, use the link below to schedule a visit and establish care. When you choose a [facility] primary care physician or advanced practice provider, you gain a long-term partner in health. Find a Primary Care Provider  Learn more about 's in-office and virtual care options: [facility]

## 2022-04-03 NOTE — Progress Notes (Signed)
Virtual Visit Consent   Shannon Ware, you are scheduled for a virtual visit with a [facility] provider today. Just as with appointments in the office, your consent must be obtained to participate. Your consent will be active for this visit and any virtual visit you may have with one of our providers in the next 365 days. If you have a MyChart account, a copy of this consent can be sent to you electronically.  As this is a virtual visit, video technology does not allow for your provider to perform a traditional examination. This may limit your provider's ability to fully assess your condition. If your provider identifies any concerns that need to be evaluated in person or the need to arrange testing (such as labs, EKG, etc.), we will make arrangements to do so. Although advances in technology are sophisticated, we cannot ensure that it will always work on either your end or our end. If the connection with a video visit is poor, the visit may have to be switched to a telephone visit. With either a video or telephone visit, we are not always able to ensure that we have a secure connection.  By engaging in this virtual visit, you consent to the provision of healthcare and authorize for your insurance to be billed (if applicable) for the services provided during this visit. Depending on your insurance coverage, you may receive a charge related to this service.  I need to obtain your verbal consent now. Are you willing to proceed with your visit today? Shannon Ware has provided verbal consent on 04/03/2022 for a virtual visit (video or telephone). Chaney Malling, PA  Date: 04/03/2022 7:16 PM  Virtual Visit via Video Note   I, [place], connected with  Shannon Ware  ([id number], 1995/12/16) on 04/03/22 at  7:15 PM EST by a video-enabled telemedicine application and verified that I am speaking with the correct person using two identifiers.  Location: Patient: Virtual Visit Location Patient:  Other: outside, parked car Provider: Virtual Visit Location Provider: Home Office   I discussed the limitations of evaluation and management by telemedicine and the availability of in person appointments. The patient expressed understanding and agreed to proceed.    History of Present Illness: Shannon Ware is a 27 y.o. who identifies as a female who was assigned female at birth, and is being seen today for vaginal itching.  HPI: 27yo female presents today due to concern of vaginal itching. She went to the ER this morning around 9am. States she has had a yellow discharge and vaginal itching x 7 days. She had a UA and urine pregnancy performed in ER. Left after waiting 10 hours. Does have appointment tomorrow at 8:30am as Aptima swab was not performed in hospital setting. Pt denies severe pelvic pain, hematuria or dysuria.   Problems:  Patient Active Problem List   Diagnosis Date Noted   Vaginal delivery 02/26/2018   Proteinuria affecting pregnancy in third trimester 02/23/2018    Allergies: No Known Allergies Medications:  Current Outpatient Medications:    ibuprofen (ADVIL,MOTRIN) 600 MG tablet, Take 1 tablet (600 mg total) by mouth every 6 (six) hours., Disp: 30 tablet, Rfl: 0   Prenatal Vit-Fe Fumarate-FA (PRENATAL MULTIVITAMIN) TABS tablet, Take 1 tablet by mouth daily at 12 noon., Disp: , Rfl:   Observations/Objective: Patient is well-developed, well-nourished in no acute distress.  Resting comfortably in seat of parked car.  Head is normocephalic, atraumatic.  No labored breathing.  Speech is clear and coherent  with logical content.  Patient is alert and oriented at baseline.    Assessment and Plan: 1. Acute vaginitis  Discussed concern with patient that based upon presentation, sounds most consistent with trichomonas, which is considered an STI and requires an in person office visit. Pt has an appointment tomorrow morning for testing and treatment. Pt without sx of UTI.  Discussed inability to treat STI virtually and will defer tx to UC tomorrow. Additionally, pt encouraged to abstain from sexual intercourse until testing and treatment has been completed.   Follow Up Instructions: I discussed the assessment and treatment plan with the patient. The patient was provided an opportunity to ask questions and all were answered. The patient agreed with the plan and demonstrated an understanding of the instructions.  A copy of instructions were sent to the patient via MyChart unless otherwise noted below.    The patient was advised to call back or seek an in-person evaluation if the symptoms worsen or if the condition fails to improve as anticipated.  Time:  I spent 8 minutes with the patient via telehealth technology discussing the above problems/concerns.    [place], PA

## 2022-04-04 LAB — GC/CHLAMYDIA PROBE AMP (~~LOC~~) NOT AT ARMC
Chlamydia: NEGATIVE
Comment: NEGATIVE
Comment: NORMAL
Neisseria Gonorrhea: NEGATIVE

## 2022-11-23 ENCOUNTER — Encounter: Payer: Self-pay | Admitting: Emergency Medicine

## 2022-11-23 ENCOUNTER — Ambulatory Visit
Admission: EM | Admit: 2022-11-23 | Discharge: 2022-11-23 | Disposition: A | Discharge status: Home / Self Care | Payer: Medicaid Other | Attending: Internal Medicine | Admitting: Internal Medicine

## 2022-11-23 DIAGNOSIS — Z113 Encounter for screening for infections with a predominantly sexual mode of transmission: Secondary | ICD-10-CM | POA: Insufficient documentation

## 2022-11-23 DIAGNOSIS — N898 Other specified noninflammatory disorders of vagina: Secondary | ICD-10-CM | POA: Insufficient documentation

## 2022-11-23 LAB — POCT URINE PREGNANCY: Preg Test, Ur: NEGATIVE

## 2022-11-23 NOTE — Discharge Instructions (Addendum)
The clinical contact you with results of the testing done today if positive.  Please follow-up with your PCP or GYN if your symptoms do not improve.  Please go to the ER for any worsening symptoms.  I hope you feel better soon!

## 2022-11-23 NOTE — ED Triage Notes (Signed)
Pt c/o vaginal odor onset 6 days ago. Pt states she has recently was treated for Trichomoniasis a couple of months ago. Pt states she also had treatment for yeast around the same time. Some pelvic pain and cramping.

## 2022-11-23 NOTE — ED Provider Notes (Signed)
[facility]    CSN: [id number] Arrival date & time: 11/23/22  1431      History   Chief Complaint Chief Complaint  Patient presents with   Exposure to STD    HPI Shannon Ware is a 27 y.o. female presents for vaginal odor.  Patient reports 6 days of a vaginal odor without discharge.  She also denies dysuria, vaginal itching, fevers, nausea/vomiting, flank pain.  No known STD exposure but she did have unprotected intercourse with her partner recently.  She has been treated for trichomonas in the past but states this is not similar.  She denies history of BV infections.  No OTC medications have been used since onset.  No other concerns at this time.   Exposure to STD    Past Medical History:  Diagnosis Date   History of chlamydia    Irregular menses     Patient Active Problem List   Diagnosis Date Noted   Vaginal delivery 02/26/2018   Proteinuria affecting pregnancy in third trimester 02/23/2018    Past Surgical History:  Procedure Laterality Date   TONSILLECTOMY      OB History     Gravida  1   Para  1   Term  1   Preterm      AB      Living  1      SAB      IAB      Ectopic      Multiple  0   Live Births  1            Home Medications    Prior to Admission medications   Medication Sig Start Date End Date Taking? Authorizing Provider  ibuprofen (ADVIL,MOTRIN) 600 MG tablet Take 1 tablet (600 mg total) by mouth every 6 (six) hours. 02/26/18   Hartsog, Marry Guan, CNM  Prenatal Vit-Fe Fumarate-FA (PRENATAL MULTIVITAMIN) TABS tablet Take 1 tablet by mouth daily at 12 noon.    [provider]    Family History Family History  Problem Relation Age of Onset   Hypertension Mother    Depression Mother    Deep vein thrombosis Father    Hypertension Father    Diabetes Maternal Grandfather     Social History Social History   Tobacco Use   Smoking status: Never   Smokeless tobacco: Never  Vaping Use   Vaping  status: Never Used  Substance Use Topics   Alcohol use: Yes    Comment: occasionally   Drug use: No     Allergies   Patient has no known allergies.   Review of Systems Review of Systems  Genitourinary:        Vaginal odor     Physical Exam Triage Vital Signs ED Triage Vitals  Encounter Vitals Group     BP 11/23/22 1447 129/87     Systolic BP Percentile --      Diastolic BP Percentile --      Pulse --      Resp --      Temp 11/23/22 1447 98 F (36.7 C)     Temp Source 11/23/22 1447 Oral     SpO2 11/23/22 1447 97 %     Weight --      Height --      Head Circumference --      Peak Flow --      Pain Score 11/23/22 1444 0     Pain Loc --  Pain Education --      Exclude from Growth Chart --    No data found.  Updated Vital Signs BP 129/87 (BP Location: Right Arm)   Temp 98 F (36.7 C) (Oral)   SpO2 97%   Visual Acuity Right Eye Distance:   Left Eye Distance:   Bilateral Distance:    Right Eye Near:   Left Eye Near:    Bilateral Near:     Physical Exam Vitals and nursing note reviewed.  Constitutional:      Appearance: Normal appearance.  HENT:     Head: Normocephalic and atraumatic.  Eyes:     Pupils: Pupils are equal, round, and reactive to light.  Cardiovascular:     Rate and Rhythm: Normal rate.  Pulmonary:     Effort: Pulmonary effort is normal.  Abdominal:     Tenderness: There is no right CVA tenderness or left CVA tenderness.  Skin:    General: Skin is warm and dry.  Neurological:     General: No focal deficit present.     Mental Status: She is alert and oriented to person, place, and time.  Psychiatric:        Mood and Affect: Mood normal.        Behavior: Behavior normal.      UC Treatments / Results  Labs (all labs ordered are listed, but only abnormal results are displayed) Labs Reviewed  RPR  HIV ANTIBODY (ROUTINE TESTING W REFLEX)  POCT URINE PREGNANCY  CERVICOVAGINAL ANCILLARY ONLY    EKG   Radiology No  results found.  Procedures Procedures (including critical care time)  Medications Ordered in UC Medications - No data to display  Initial Impression / Assessment and Plan / UC Course  I have reviewed the triage vital signs and the nursing notes.  Pertinent labs & imaging results that were available during my care of the patient were reviewed by me and considered in my medical decision making (see chart for details).     Reviewed exam and symptoms with patient.  No red flags.  STD testing is ordered and will contact for any positive results will await results prior to initiating any treatment.  PCP or GYN follow-up if symptoms do not improve.  ER precautions reviewed and patient verbalized understanding. Final Clinical Impressions(s) / UC Diagnoses   Final diagnoses:  Screening examination for STD (sexually transmitted disease)  Vaginal odor     Discharge Instructions      The clinical contact you with results of the testing done today if positive.  Please follow-up with your PCP or GYN if your symptoms do not improve.  Please go to the ER for any worsening symptoms.  I hope you feel better soon!    ED Prescriptions   None    PDMP not reviewed this encounter.   Radford Pax, NP 11/23/22 1504

## 2022-11-24 ENCOUNTER — Telehealth (HOSPITAL_COMMUNITY): Payer: Self-pay | Admitting: Emergency Medicine

## 2022-11-24 MED ORDER — METRONIDAZOLE 500 MG PO TABS: 500.0000 mg | ORAL_TABLET | Freq: Two times a day (BID) | ORAL | 0 refills | Status: DC

## 2022-11-24 NOTE — Telephone Encounter (Signed)
Metronidazole for positive BV

## 2022-11-25 LAB — RPR: RPR Ser Ql: NONREACTIVE

## 2022-11-25 LAB — HIV ANTIBODY (ROUTINE TESTING W REFLEX): HIV Screen 4th Generation wRfx: NONREACTIVE

## 2022-11-26 LAB — CERVICOVAGINAL ANCILLARY ONLY
Bacterial Vaginitis (gardnerella): POSITIVE — AB
Candida Glabrata: NEGATIVE
Candida Vaginitis: NEGATIVE
Chlamydia: NEGATIVE
Comment: NEGATIVE
Comment: NEGATIVE
Comment: NEGATIVE
Comment: NEGATIVE
Comment: NEGATIVE
Comment: NORMAL
Neisseria Gonorrhea: NEGATIVE
Trichomonas: NEGATIVE

## 2023-10-11 ENCOUNTER — Other Ambulatory Visit: Payer: Self-pay

## 2023-10-11 ENCOUNTER — Ambulatory Visit
Admission: RE | Admit: 2023-10-11 | Discharge: 2023-10-11 | Disposition: A | Discharge status: Home / Self Care | Payer: Self-pay | Attending: Physician Assistant | Admitting: Physician Assistant

## 2023-10-11 VITALS — BP 138/83 | HR 83 | Temp 98.3°F | Resp 18 | Ht 62.0 in | Wt 160.0 lb

## 2023-10-11 DIAGNOSIS — R102 Pelvic and perineal pain: Secondary | ICD-10-CM

## 2023-10-11 DIAGNOSIS — N898 Other specified noninflammatory disorders of vagina: Secondary | ICD-10-CM | POA: Insufficient documentation

## 2023-10-11 LAB — POCT URINALYSIS DIP (MANUAL ENTRY)
Bilirubin, UA: NEGATIVE
Glucose, UA: NEGATIVE mg/dL
Ketones, POC UA: NEGATIVE mg/dL
Nitrite, UA: NEGATIVE
Protein Ur, POC: 30 mg/dL — AB
Spec Grav, UA: >=1.03 — AB (ref 1.010–1.025)
Urobilinogen, UA: 0.2 U/dL
pH, UA: 5.5 (ref 5.0–8.0)

## 2023-10-11 LAB — POCT URINE PREGNANCY: Preg Test, Ur: NEGATIVE

## 2023-10-11 MED ORDER — FLUCONAZOLE 150 MG PO TABS: 150.0000 mg | ORAL_TABLET | ORAL | 0 refills | Status: DC | PRN

## 2023-10-11 MED ORDER — NYSTATIN 100000 UNIT/GM EX CREA: TOPICAL_CREAM | CUTANEOUS | 0 refills | Status: DC

## 2023-10-11 NOTE — Discharge Instructions (Addendum)
 You are seen today for concerns of vulvovaginal discomfort and vaginal discharge changes.  Your symptoms appear consistent with a likely yeast infection but we have collected a cervicovaginal swab to assess for BV, yeast, trichomonas, gonorrhea and chlamydia. Your urine was notable for signs of white blood cells as well as blood which can be consistent with a UTI.  Since you are not having pain with urination I suspect this might be contamination from your vulvovaginal concern so I am sending a sample of your urine off for urine culture for definitive rule out of a UTI or infection.  We will keep you updated with those results once they are available  We will keep you updated on the results of your cervicovaginal swab once the results are available.  If medication or treatment is indicated by those results that will be sent into the pharmacy that we have on file. Please make sure that you are practicing safe sex and using barrier methods to prevent exposure. It is recommended to avoid intercourse until you have the results back from testing and have completed any treatments that are sent in for you.   To assist with your symptoms while we are waiting on your test results I have sent in a medication called Diflucan  for you to take once every 72 hours.  This medication treats yeast infections fairly well and has relatively low side effect burden.  To assist with any external irritation I am sending in a cream called nystatin .  This medication helps to treat yeast infections of the skin.  Please do not use this inside the vagina as it can cause further irritation.

## 2023-10-11 NOTE — ED Provider Notes (Signed)
 [facility]    CSN: [id number] Arrival date & time: 10/11/23  1035      History   Chief Complaint Chief Complaint  Patient presents with   Vaginal Discharge    I have severe symptoms of a yeast infection. Thick white discharge, excessive itching and no odor. - Entered by patient    HPI Shannon Ware is a 28 y.o. female.   HPI Pt reports concerns for vaginal discharge changes and vaginal irritation She states this has been ongoing for about 2 weeks and started with mild irritation that has progressed to include thick white vaginal discharge and vulvovaginal itching and discomfort  Interventions: one day OTC pill for yeast   She reports some suprapubic pain and inflammation around her rectum since the symptoms started    Past Medical History:  Diagnosis Date   History of chlamydia    Irregular menses     Patient Active Problem List   Diagnosis Date Noted   Vaginal delivery 02/26/2018   Proteinuria affecting pregnancy in third trimester 02/23/2018    Past Surgical History:  Procedure Laterality Date   TONSILLECTOMY      OB History     Gravida  1   Para  1   Term  1   Preterm      AB      Living  1      SAB      IAB      Ectopic      Multiple  0   Live Births  1            Home Medications    Prior to Admission medications   Medication Sig Start Date End Date Taking? Authorizing Provider  fluconazole  (DIFLUCAN ) 150 MG tablet Take 1 tablet (150 mg total) by mouth every three (3) days as needed. May repeat in 3 days if symptoms not resolved 10/11/23  Yes Demarious Kapur E, PA-C  nystatin  cream (MYCOSTATIN ) Apply to affected area 2 times daily 10/11/23  Yes Zivah Mayr E, PA-C  ibuprofen  (ADVIL ,MOTRIN ) 600 MG tablet Take 1 tablet (600 mg total) by mouth every 6 (six) hours. 02/26/18   Hartsog, Carol A, CNM  metroNIDAZOLE  (FLAGYL ) 500 MG tablet Take 1 tablet (500 mg total) by mouth 2 (two) times daily. 11/24/22   LampteyAleene KIDD, MD   Prenatal Vit-Fe Fumarate-FA (PRENATAL MULTIVITAMIN) TABS tablet Take 1 tablet by mouth daily at 12 noon.    [provider]    Family History Family History  Problem Relation Age of Onset   Hypertension Mother    Depression Mother    Deep vein thrombosis Father    Hypertension Father    Diabetes Maternal Grandfather     Social History Social History   Tobacco Use   Smoking status: Never   Smokeless tobacco: Never  Vaping Use   Vaping status: Never Used  Substance Use Topics   Alcohol use: Yes    Comment: occasionally   Drug use: No     Allergies   Patient has no known allergies.   Review of Systems Review of Systems  Constitutional:  Negative for chills and fever.  Gastrointestinal:  Positive for abdominal pain.  Genitourinary:  Positive for pelvic pain, vaginal discharge and vaginal pain. Negative for difficulty urinating, dysuria, flank pain, genital sores and vaginal bleeding.  Skin:  Negative for rash.     Physical Exam Triage Vital Signs ED Triage Vitals  Encounter Vitals Group  BP 10/11/23 1049 138/83     Girls Systolic BP Percentile --      Girls Diastolic BP Percentile --      Boys Systolic BP Percentile --      Boys Diastolic BP Percentile --      Pulse Rate 10/11/23 1049 83     Resp 10/11/23 1049 18     Temp 10/11/23 1049 98.3 F (36.8 C)     Temp Source 10/11/23 1049 Oral     SpO2 10/11/23 1049 98 %     Weight 10/11/23 1051 160 lb (72.6 kg)     Height 10/11/23 1051 5' 2 (1.575 m)     Head Circumference --      Peak Flow --      Pain Score 10/11/23 1051 2     Pain Loc --      Pain Education --      Exclude from Growth Chart --    No data found.  Updated Vital Signs BP 138/83 (BP Location: Right Arm)   Pulse 83   Temp 98.3 F (36.8 C) (Oral)   Resp 18   Ht 5' 2 (1.575 m)   Wt 160 lb (72.6 kg)   SpO2 98%   Breastfeeding No   BMI 29.26 kg/m   Visual Acuity Right Eye Distance:   Left Eye Distance:   Bilateral  Distance:    Right Eye Near:   Left Eye Near:    Bilateral Near:     Physical Exam Vitals reviewed.  Constitutional:      General: She is awake.     Appearance: Normal appearance. She is well-developed and well-groomed.  HENT:     Head: Normocephalic and atraumatic.  Eyes:     General: Lids are normal. Gaze aligned appropriately.     Extraocular Movements: Extraocular movements intact.     Conjunctiva/sclera: Conjunctivae normal.  Pulmonary:     Effort: Pulmonary effort is normal.  Abdominal:     General: Abdomen is flat. Bowel sounds are normal.     Palpations: Abdomen is soft.     Tenderness: There is no abdominal tenderness.   Neurological:     Mental Status: She is alert and oriented to person, place, and time.  Psychiatric:        Attention and Perception: Attention and perception normal.        Mood and Affect: Mood and affect normal.        Speech: Speech normal.        Behavior: Behavior normal. Behavior is cooperative.      UC Treatments / Results  Labs (all labs ordered are listed, but only abnormal results are displayed) Labs Reviewed  POCT URINALYSIS DIP (MANUAL ENTRY) - Abnormal; Notable for the following components:      Result Value   Clarity, UA turbid (*)    Spec Grav, UA >=1.030 (*)    Blood, UA trace-intact (*)    Protein Ur, POC =30 (*)    Leukocytes, UA Trace (*)    All other components within normal limits  URINE CULTURE  POCT URINE PREGNANCY  CERVICOVAGINAL ANCILLARY ONLY    EKG   Radiology No results found.  Procedures Procedures (including critical care time)  Medications Ordered in UC Medications - No data to display  Initial Impression / Assessment and Plan / UC Course  I have reviewed the triage vital signs and the nursing notes.  Pertinent labs & imaging results that were available during my  care of the patient were reviewed by me and considered in my medical decision making (see chart for details).      Final  Clinical Impressions(s) / UC Diagnoses   Final diagnoses:  Vaginal discharge  Vulvovaginal discomfort   Patient presents today with concerns for vulvovaginal discharge and discomfort that has been ongoing for approximately 2 weeks.  She reports vaginal discharge is thick and white and she does not notice an odor.  Urine dip is notable for leukocytes and red blood cells but I am concerned this may be contamination from vaginal process so we will send urine culture off for definitive rule out of UTI.  Will also collect cervicovaginal swab to assess for BV, yeast, trichomoniasis, gonorrhea, chlamydia.  Results of testing to dictate further management.  Will send Diflucan  and nystatin  cream to assist with current symptoms as they sound most consistent with a yeast infection.  Follow-up as needed per test results or for persistent/progressing symptoms    Discharge Instructions      You are seen today for concerns of vulvovaginal discomfort and vaginal discharge changes.  Your symptoms appear consistent with a likely yeast infection but we have collected a cervicovaginal swab to assess for BV, yeast, trichomonas, gonorrhea and chlamydia. Your urine was notable for signs of white blood cells as well as blood which can be consistent with a UTI.  Since you are not having pain with urination I suspect this might be contamination from your vulvovaginal concern so I am sending a sample of your urine off for urine culture for definitive rule out of a UTI or infection.  We will keep you updated with those results once they are available  We will keep you updated on the results of your cervicovaginal swab once the results are available.  If medication or treatment is indicated by those results that will be sent into the pharmacy that we have on file. Please make sure that you are practicing safe sex and using barrier methods to prevent exposure. It is recommended to avoid intercourse until you have the results back  from testing and have completed any treatments that are sent in for you.   To assist with your symptoms while we are waiting on your test results I have sent in a medication called Diflucan  for you to take once every 72 hours.  This medication treats yeast infections fairly well and has relatively low side effect burden.  To assist with any external irritation I am sending in a cream called nystatin .  This medication helps to treat yeast infections of the skin.  Please do not use this inside the vagina as it can cause further irritation.     ED Prescriptions     Medication Sig Dispense Auth. Provider   fluconazole  (DIFLUCAN ) 150 MG tablet Take 1 tablet (150 mg total) by mouth every three (3) days as needed. May repeat in 3 days if symptoms not resolved 3 tablet Wretha Laris E, PA-C   nystatin  cream (MYCOSTATIN ) Apply to affected area 2 times daily 30 g Hermilo Dutter E, PA-C      PDMP not reviewed this encounter.   Adrijana Haros, Rocky BRAVO, PA-C 10/11/23 1140

## 2023-10-11 NOTE — ED Triage Notes (Signed)
 Pt presents with complaints of vaginal discharge x 1.5 weeks. States she took a one time dose of OTC medication for yeast infection three days ago, made symptoms worse. States it itches very badly. Currently rates overall pain a 2/10. Denies urinary symptoms.

## 2023-10-12 LAB — CERVICOVAGINAL ANCILLARY ONLY
Bacterial Vaginitis (gardnerella): NEGATIVE
Candida Glabrata: NEGATIVE
Candida Vaginitis: POSITIVE — AB
Chlamydia: NEGATIVE
Comment: NEGATIVE
Comment: NEGATIVE
Comment: NEGATIVE
Comment: NEGATIVE
Comment: NEGATIVE
Comment: NORMAL
Neisseria Gonorrhea: NEGATIVE
Trichomonas: NEGATIVE

## 2023-10-12 LAB — URINE CULTURE: Culture: <10000 — AB

## 2023-10-13 ENCOUNTER — Ambulatory Visit (HOSPITAL_COMMUNITY): Payer: Self-pay

## 2023-12-02 ENCOUNTER — Telehealth: Payer: Self-pay

## 2023-12-02 ENCOUNTER — Telehealth: Payer: Self-pay | Admitting: Physician Assistant

## 2023-12-02 DIAGNOSIS — L03113 Cellulitis of right upper limb: Secondary | ICD-10-CM

## 2023-12-02 MED ORDER — CEPHALEXIN 500 MG PO CAPS: 500.0000 mg | ORAL_CAPSULE | Freq: Three times a day (TID) | ORAL | 0 refills | Status: AC

## 2023-12-02 MED ORDER — HYDROXYZINE PAMOATE 25 MG PO CAPS: 25.0000 mg | ORAL_CAPSULE | Freq: Three times a day (TID) | ORAL | 0 refills | Status: AC | PRN

## 2023-12-02 NOTE — Patient Instructions (Signed)
  Rock Jobs, thank you for joining Elsie Velma Lunger, PA-C for today's virtual visit.  While this provider is not your primary care provider (PCP), if your PCP is located in our provider database this encounter information will be shared with them immediately following your visit.   A [facility] MyChart account gives you access to today's visit and all your visits, tests, and labs performed at [facility]  click here if you don't have a [facility] MyChart account or go to [email or web address]  Consent: (Patient) Shannon Ware provided verbal consent for this virtual visit at the beginning of the encounter.  Current Medications:  Current Outpatient Medications:    fluconazole  (DIFLUCAN ) 150 MG tablet, Take 1 tablet (150 mg total) by mouth every three (3) days as needed. May repeat in 3 days if symptoms not resolved, Disp: 3 tablet, Rfl: 0   ibuprofen  (ADVIL ,MOTRIN ) 600 MG tablet, Take 1 tablet (600 mg total) by mouth every 6 (six) hours., Disp: 30 tablet, Rfl: 0   metroNIDAZOLE  (FLAGYL ) 500 MG tablet, Take 1 tablet (500 mg total) by mouth 2 (two) times daily., Disp: 14 tablet, Rfl: 0   nystatin  cream (MYCOSTATIN ), Apply to affected area 2 times daily, Disp: 30 g, Rfl: 0   Prenatal Vit-Fe Fumarate-FA (PRENATAL MULTIVITAMIN) TABS tablet, Take 1 tablet by mouth daily at 12 noon., Disp: , Rfl:    Medications ordered in this encounter:  No orders of the defined types were placed in this encounter.    *If you need refills on other medications prior to your next appointment, please contact your pharmacy*  Follow-Up: Call back or seek an in-person evaluation if the symptoms worsen or if the condition fails to improve as anticipated.  [facility] [phone]  Other Instructions Please keep the skin clean and dry, Apply cool compress to the area to help with swelling. Take the hydroxyzine  and antibiotic as directed. If you note any non-resolving, new,  or worsening symptoms despite treatment, please seek an in-person evaluation ASAP.    If you have been instructed to have an in-person evaluation today at a local Urgent Care facility, please use the link below. It will take you to a list of all of our available [facility], including address, phone number and hours of operation. Please do not delay care.  [facility]  If you or a family member do not have a primary care provider, use the link below to schedule a visit and establish care. When you choose a [facility] primary care physician or advanced practice provider, you gain a long-term partner in health. Find a Primary Care Provider  Learn more about [facility] in-office and virtual care options:  - Get Care Now

## 2023-12-02 NOTE — Progress Notes (Signed)
 Virtual Visit Consent   Shannon Ware, you are scheduled for a virtual visit with a [facility] provider today. Just as with appointments in the office, your consent must be obtained to participate. Your consent will be active for this visit and any virtual visit you may have with one of our providers in the next 365 days. If you have a MyChart account, a copy of this consent can be sent to you electronically.  As this is a virtual visit, video technology does not allow for your provider to perform a traditional examination. This may limit your provider's ability to fully assess your condition. If your provider identifies any concerns that need to be evaluated in person or the need to arrange testing (such as labs, EKG, etc.), we will make arrangements to do so. Although advances in technology are sophisticated, we cannot ensure that it will always work on either your end or our end. If the connection with a video visit is poor, the visit may have to be switched to a telephone visit. With either a video or telephone visit, we are not always able to ensure that we have a secure connection.  By engaging in this virtual visit, you consent to the provision of healthcare and authorize for your insurance to be billed (if applicable) for the services provided during this visit. Depending on your insurance coverage, you may receive a charge related to this service.  I need to obtain your verbal consent now. Are you willing to proceed with your visit today? Shannon Ware has provided verbal consent on 12/02/2023 for a virtual visit (video or telephone). Shannon Ware, [place]  Date: 12/02/2023 8:58 AM   Virtual Visit via Video Note   I, Shannon Ware, connected with  Shannon Ware  ([id number], 12-02-1995) on 12/02/23 at  8:15 AM EDT by a video-enabled telemedicine application and verified that I am speaking with the correct person using two identifiers.  Location: Patient: Virtual Visit Location  Patient: Home Provider: Virtual Visit Location Provider: Home Office   I discussed the limitations of evaluation and management by telemedicine and the availability of in person appointments. The patient expressed understanding and agreed to proceed.    History of Present Illness: Shannon Ware is a 28 y.o. who identifies as a female who was assigned female at birth, and is being seen today for redness, swelling and pain of R arm. Notes symptoms starting about 48 hours ago with [place] in [place]. Notes bees flying around but does not recall getting bit or sting. Noted increased redness and swelling around the area, now with small cluster of blisters in the middle of the area. Denies any burning or stinging pain. No symptoms elsewhere on the extremity. Denies fever, chills.  HPI: HPI  Problems:  Patient Active Problem List   Diagnosis Date Noted   Vaginal delivery 02/26/2018   Proteinuria affecting pregnancy in third trimester 02/23/2018    Allergies: No Known Allergies Medications:  Current Outpatient Medications:    cephALEXin  (KEFLEX ) 500 MG capsule, Take 1 capsule (500 mg total) by mouth 3 (three) times daily for 7 days., Disp: 21 capsule, Rfl: 0   hydrOXYzine  (VISTARIL ) 25 MG capsule, Take 1 capsule (25 mg total) by mouth every 8 (eight) hours as needed., Disp: 30 capsule, Rfl: 0  Observations/Objective: Patient is well-developed, well-nourished in no acute distress.  Resting comfortably at home.  Head is normocephalic, atraumatic.  No labored breathing.  Speech is clear and  coherent with logical content.  Patient is alert and oriented at baseline.  9-10 cm area of erythema with swelling noted of R upper arm. In the center of this, there is a small cluster of tiny fluid filled sacs.  Assessment and Plan: 1. Cellulitis of right arm (Primary) - hydrOXYzine  (VISTARIL ) 25 MG capsule; Take 1 capsule (25 mg total) by mouth every 8 (eight) hours as needed.   Dispense: 30 capsule; Refill: 0 - cephALEXin  (KEFLEX ) 500 MG capsule; Take 1 capsule (500 mg total) by mouth 3 (three) times daily for 7 days.  Dispense: 21 capsule; Refill: 0  Supportive measures and OTC medications reviewed. Hydroxyzine  and Keflex  per orders. Strict in-person precautions reviewed with patient.  Follow Up Instructions: I discussed the assessment and treatment plan with the patient. The patient was provided an opportunity to ask questions and all were answered. The patient agreed with the plan and demonstrated an understanding of the instructions.  A copy of instructions were sent to the patient via MyChart unless otherwise noted below.   The patient was advised to call back or seek an in-person evaluation if the symptoms worsen or if the condition fails to improve as anticipated.    Shannon Velma Lunger, PA-C

## 2023-12-03 ENCOUNTER — Telehealth: Admitting: Physician Assistant

## 2023-12-03 DIAGNOSIS — Z5321 Procedure and treatment not carried out due to patient leaving prior to being seen by health care provider: Secondary | ICD-10-CM

## 2023-12-03 NOTE — Progress Notes (Signed)
 Patient arrived prior to appt time and left right at appt time as provider logged in. Provider sent links x 3 and awaited the allotted 10 minute time frame. The patient never made any attempts to log back in. Will mark No Charge and left without being seen.  Fortunato Dickinson, PA-C [facility]
